# Patient Record
Sex: Male | Born: 1935 | Marital: Married
Health system: Southern US, Community
[De-identification: ages and names within clinical notes are randomized; demographics above are authoritative.]

---

## 2007-06-09 ENCOUNTER — Ambulatory Visit: Payer: Self-pay | Admitting: Internal Medicine

## 2007-11-14 ENCOUNTER — Ambulatory Visit: Payer: Self-pay | Admitting: Internal Medicine

## 2011-05-07 ENCOUNTER — Inpatient Hospital Stay: Payer: Self-pay | Admitting: Internal Medicine

## 2012-03-09 ENCOUNTER — Encounter: Payer: Self-pay | Admitting: Cardiothoracic Surgery

## 2012-03-09 ENCOUNTER — Encounter: Payer: Self-pay | Admitting: Nurse Practitioner

## 2012-03-25 ENCOUNTER — Encounter: Payer: Self-pay | Admitting: Cardiothoracic Surgery

## 2012-03-25 ENCOUNTER — Encounter: Payer: Self-pay | Admitting: Nurse Practitioner

## 2012-04-25 ENCOUNTER — Encounter: Payer: Self-pay | Admitting: Cardiothoracic Surgery

## 2012-04-25 ENCOUNTER — Encounter: Payer: Self-pay | Admitting: Nurse Practitioner

## 2012-05-25 ENCOUNTER — Encounter: Payer: Self-pay | Admitting: Cardiothoracic Surgery

## 2012-05-25 ENCOUNTER — Encounter: Payer: Self-pay | Admitting: Nurse Practitioner

## 2012-06-25 ENCOUNTER — Encounter: Payer: Self-pay | Admitting: Cardiothoracic Surgery

## 2012-06-25 ENCOUNTER — Encounter: Payer: Self-pay | Admitting: Nurse Practitioner

## 2012-12-17 ENCOUNTER — Ambulatory Visit: Payer: Self-pay | Admitting: Family Medicine

## 2013-04-01 ENCOUNTER — Ambulatory Visit: Payer: Self-pay | Admitting: Family Medicine

## 2013-04-01 ENCOUNTER — Inpatient Hospital Stay: Payer: Self-pay | Admitting: Internal Medicine

## 2013-04-01 LAB — TROPONIN I: Troponin-I: 0.02 ng/mL

## 2013-04-01 LAB — URINALYSIS, COMPLETE
Bacteria: NONE SEEN
Bacteria: NONE SEEN
Bilirubin,UR: NEGATIVE
Bilirubin,UR: NEGATIVE
Glucose,UR: NEGATIVE mg/dL (ref 0–75)
Hyaline Cast: 3
Ketone: NEGATIVE
Leukocyte Esterase: NEGATIVE
Leukocyte Esterase: NEGATIVE
Leukocyte Esterase: NEGATIVE
Nitrite: NEGATIVE
Nitrite: NEGATIVE
Nitrite: NEGATIVE
Ph: 6 (ref 4.5–8.0)
Ph: 5 (ref 4.5–8.0)
Protein: NEGATIVE
RBC,UR: 1 /HPF (ref 0–5)
Specific Gravity: 1.011 (ref 1.003–1.030)
Squamous Epithelial: NONE SEEN
WBC UR: 1 /HPF (ref 0–5)

## 2013-04-01 LAB — CBC WITH DIFFERENTIAL/PLATELET
Basophil #: 0.1 10*3/uL (ref 0.0–0.1)
Basophil %: 1.2 %
Eosinophil %: 0.8 %
HCT: 38.1 % — ABNORMAL LOW (ref 40.0–52.0)
Lymphocyte #: 2 10*3/uL (ref 1.0–3.6)
Lymphocyte %: 19.3 %
MCHC: 33 g/dL (ref 32.0–36.0)
MCV: 96 fL (ref 80–100)
Monocyte #: 1 x10 3/mm (ref 0.2–1.0)
Monocyte %: 10.1 %
Neutrophil #: 7.1 10*3/uL — ABNORMAL HIGH (ref 1.4–6.5)
Neutrophil %: 68.6 %
RDW: 18.1 % — ABNORMAL HIGH (ref 11.5–14.5)

## 2013-04-01 LAB — CBC
HCT: 37.6 % — ABNORMAL LOW (ref 40.0–52.0)
MCHC: 33.6 g/dL (ref 32.0–36.0)
Platelet: 73 10*3/uL — ABNORMAL LOW (ref 150–440)
RDW: 18.1 % — ABNORMAL HIGH (ref 11.5–14.5)
WBC: 9.7 10*3/uL (ref 3.8–10.6)

## 2013-04-01 LAB — COMPREHENSIVE METABOLIC PANEL
Albumin: 3.2 g/dL — ABNORMAL LOW (ref 3.4–5.0)
Albumin: 3 g/dL — ABNORMAL LOW (ref 3.4–5.0)
Alkaline Phosphatase: 91 U/L (ref 50–136)
Alkaline Phosphatase: 90 U/L (ref 50–136)
Anion Gap: 9 (ref 7–16)
BUN: 94 mg/dL — ABNORMAL HIGH (ref 7–18)
Bilirubin,Total: 0.7 mg/dL (ref 0.2–1.0)
Calcium, Total: 9.3 mg/dL (ref 8.5–10.1)
Chloride: 104 mmol/L (ref 98–107)
Co2: 25 mmol/L (ref 21–32)
EGFR (Non-African Amer.): 16 — ABNORMAL LOW
Glucose: 109 mg/dL — ABNORMAL HIGH (ref 65–99)
Potassium: 4.5 mmol/L (ref 3.5–5.1)
SGOT(AST): 41 U/L — ABNORMAL HIGH (ref 15–37)
SGPT (ALT): 22 U/L (ref 12–78)
Sodium: 137 mmol/L (ref 136–145)
Sodium: 137 mmol/L (ref 136–145)
Total Protein: 8.2 g/dL (ref 6.4–8.2)
Total Protein: 7.8 g/dL (ref 6.4–8.2)

## 2013-04-01 LAB — PRO B NATRIURETIC PEPTIDE: B-Type Natriuretic Peptide: 1922 pg/mL — ABNORMAL HIGH (ref 0–450)

## 2013-04-02 LAB — CBC WITH DIFFERENTIAL/PLATELET
Basophil #: 0 10*3/uL (ref 0.0–0.1)
Eosinophil #: 0.1 10*3/uL (ref 0.0–0.7)
HGB: 11.3 g/dL — ABNORMAL LOW (ref 13.0–18.0)
Lymphocyte #: 1.3 10*3/uL (ref 1.0–3.6)
Lymphocyte %: 20.9 %
MCHC: 33.9 g/dL (ref 32.0–36.0)
MCV: 96 fL (ref 80–100)
Monocyte %: 11.3 %
Neutrophil #: 4.3 10*3/uL (ref 1.4–6.5)
Neutrophil %: 66.8 %
Platelet: 59 10*3/uL — ABNORMAL LOW (ref 150–440)
RBC: 3.49 10*6/uL — ABNORMAL LOW (ref 4.40–5.90)
RDW: 18.3 % — ABNORMAL HIGH (ref 11.5–14.5)
WBC: 6.4 10*3/uL (ref 3.8–10.6)

## 2013-04-02 LAB — BASIC METABOLIC PANEL
BUN: 75 mg/dL — ABNORMAL HIGH (ref 7–18)
Chloride: 109 mmol/L — ABNORMAL HIGH (ref 98–107)
EGFR (African American): 33 — ABNORMAL LOW
EGFR (Non-African Amer.): 28 — ABNORMAL LOW
Glucose: 88 mg/dL (ref 65–99)
Osmolality: 305 (ref 275–301)
Potassium: 4 mmol/L (ref 3.5–5.1)
Sodium: 142 mmol/L (ref 136–145)

## 2013-04-03 LAB — CREATININE, SERUM: EGFR (Non-African Amer.): 40 — ABNORMAL LOW

## 2013-07-12 ENCOUNTER — Ambulatory Visit: Payer: Self-pay | Admitting: Internal Medicine

## 2013-07-26 ENCOUNTER — Ambulatory Visit: Payer: Self-pay | Admitting: Internal Medicine

## 2013-07-28 ENCOUNTER — Inpatient Hospital Stay: Payer: Self-pay | Admitting: Internal Medicine

## 2013-07-28 LAB — CBC WITH DIFFERENTIAL/PLATELET
Basophil #: 0.1 10*3/uL (ref 0.0–0.1)
Basophil %: 0.9 %
Eosinophil #: 0 10*3/uL (ref 0.0–0.7)
HCT: 31.7 % — ABNORMAL LOW (ref 40.0–52.0)
Lymphocyte #: 0.9 10*3/uL — ABNORMAL LOW (ref 1.0–3.6)
Lymphocyte %: 13.8 %
Monocyte #: 0.7 x10 3/mm (ref 0.2–1.0)
Monocyte %: 10 %
Neutrophil #: 5.1 10*3/uL (ref 1.4–6.5)
Platelet: 117 10*3/uL — ABNORMAL LOW (ref 150–440)
RBC: 3.44 10*6/uL — ABNORMAL LOW (ref 4.40–5.90)
RDW: 16.1 % — ABNORMAL HIGH (ref 11.5–14.5)
WBC: 6.8 10*3/uL (ref 3.8–10.6)

## 2013-07-28 LAB — BASIC METABOLIC PANEL
Anion Gap: 4 — ABNORMAL LOW (ref 7–16)
Calcium, Total: 9.1 mg/dL (ref 8.5–10.1)
Co2: 32 mmol/L (ref 21–32)
Creatinine: 1.32 mg/dL — ABNORMAL HIGH (ref 0.60–1.30)
EGFR (Non-African Amer.): 52 — ABNORMAL LOW
Osmolality: 289 (ref 275–301)
Potassium: 4.4 mmol/L (ref 3.5–5.1)
Sodium: 135 mmol/L — ABNORMAL LOW (ref 136–145)

## 2013-07-29 LAB — CBC WITH DIFFERENTIAL/PLATELET
Basophil #: 0 10*3/uL (ref 0.0–0.1)
Basophil %: 0.3 %
Eosinophil #: 0 10*3/uL (ref 0.0–0.7)
Eosinophil %: 0.8 %
HCT: 27.9 % — ABNORMAL LOW (ref 40.0–52.0)
HGB: 9.2 g/dL — ABNORMAL LOW (ref 13.0–18.0)
Lymphocyte #: 1.1 10*3/uL (ref 1.0–3.6)
MCHC: 33 g/dL (ref 32.0–36.0)
Monocyte #: 0.8 x10 3/mm (ref 0.2–1.0)
Neutrophil %: 68.6 %
RBC: 3.06 10*6/uL — ABNORMAL LOW (ref 4.40–5.90)
RDW: 15.3 % — ABNORMAL HIGH (ref 11.5–14.5)
WBC: 6.3 10*3/uL (ref 3.8–10.6)

## 2013-07-29 LAB — BASIC METABOLIC PANEL
BUN: 36 mg/dL — ABNORMAL HIGH (ref 7–18)
Calcium, Total: 8.9 mg/dL (ref 8.5–10.1)
Chloride: 99 mmol/L (ref 98–107)
EGFR (African American): >60
EGFR (Non-African Amer.): 56 — ABNORMAL LOW
Glucose: 90 mg/dL (ref 65–99)
Potassium: 4.4 mmol/L (ref 3.5–5.1)
Sodium: 135 mmol/L — ABNORMAL LOW (ref 136–145)

## 2013-07-31 LAB — BASIC METABOLIC PANEL
Anion Gap: 5 — ABNORMAL LOW (ref 7–16)
BUN: 52 mg/dL — ABNORMAL HIGH (ref 7–18)
Co2: 28 mmol/L (ref 21–32)
Creatinine: 1.89 mg/dL — ABNORMAL HIGH (ref 0.60–1.30)
EGFR (African American): 39 — ABNORMAL LOW
Glucose: 126 mg/dL — ABNORMAL HIGH (ref 65–99)

## 2013-07-31 LAB — CBC WITH DIFFERENTIAL/PLATELET
Basophil %: 0.1 %
Eosinophil %: 1.3 %
HCT: 27.9 % — ABNORMAL LOW (ref 40.0–52.0)
Lymphocyte #: 1.3 10*3/uL (ref 1.0–3.6)
Lymphocyte %: 20.1 %
MCV: 91 fL (ref 80–100)
Monocyte #: 0.7 x10 3/mm (ref 0.2–1.0)
Neutrophil #: 4.4 10*3/uL (ref 1.4–6.5)
Neutrophil %: 67.3 %
Platelet: 107 10*3/uL — ABNORMAL LOW (ref 150–440)
RBC: 3.08 10*6/uL — ABNORMAL LOW (ref 4.40–5.90)
RDW: 15.3 % — ABNORMAL HIGH (ref 11.5–14.5)
WBC: 6.5 10*3/uL (ref 3.8–10.6)

## 2013-08-01 LAB — CBC WITH DIFFERENTIAL/PLATELET
Basophil #: 0 10*3/uL (ref 0.0–0.1)
Eosinophil #: 0 10*3/uL (ref 0.0–0.7)
Eosinophil %: 0.5 %
HCT: 29.1 % — ABNORMAL LOW (ref 40.0–52.0)
Lymphocyte #: 1 10*3/uL (ref 1.0–3.6)
Lymphocyte %: 19 %
MCH: 30.6 pg (ref 26.0–34.0)
MCHC: 33.4 g/dL (ref 32.0–36.0)
MCV: 91 fL (ref 80–100)
Monocyte #: 0.6 x10 3/mm (ref 0.2–1.0)
Monocyte %: 10.7 %
Neutrophil %: 69.1 %
Platelet: 117 10*3/uL — ABNORMAL LOW (ref 150–440)
RDW: 15.7 % — ABNORMAL HIGH (ref 11.5–14.5)
WBC: 5.4 10*3/uL (ref 3.8–10.6)

## 2013-08-01 LAB — BASIC METABOLIC PANEL
Anion Gap: 9 (ref 7–16)
Chloride: 97 mmol/L — ABNORMAL LOW (ref 98–107)
Co2: 26 mmol/L (ref 21–32)
Creatinine: 1.46 mg/dL — ABNORMAL HIGH (ref 0.60–1.30)
EGFR (Non-African Amer.): 46 — ABNORMAL LOW
Glucose: 128 mg/dL — ABNORMAL HIGH (ref 65–99)
Potassium: 4.3 mmol/L (ref 3.5–5.1)

## 2013-08-02 LAB — BASIC METABOLIC PANEL
Anion Gap: 3 — ABNORMAL LOW (ref 7–16)
BUN: 37 mg/dL — ABNORMAL HIGH (ref 7–18)
Chloride: 104 mmol/L (ref 98–107)
Creatinine: 1.11 mg/dL (ref 0.60–1.30)
EGFR (African American): >60
Glucose: 98 mg/dL (ref 65–99)
Potassium: 4.9 mmol/L (ref 3.5–5.1)
Sodium: 136 mmol/L (ref 136–145)

## 2013-08-02 LAB — CULTURE, BLOOD (SINGLE)

## 2013-08-04 LAB — CBC WITH DIFFERENTIAL/PLATELET
Basophil %: 0.6 %
Eosinophil #: 0.1 10*3/uL (ref 0.0–0.7)
Eosinophil %: 1.4 %
HGB: 9.7 g/dL — ABNORMAL LOW (ref 13.0–18.0)
Lymphocyte #: 1.2 10*3/uL (ref 1.0–3.6)
Lymphocyte %: 19.8 %
MCH: 31.3 pg (ref 26.0–34.0)
MCHC: 33.5 g/dL (ref 32.0–36.0)
MCV: 93 fL (ref 80–100)
Monocyte #: 0.7 x10 3/mm (ref 0.2–1.0)
Monocyte %: 12 %
Neutrophil #: 3.9 10*3/uL (ref 1.4–6.5)
Neutrophil %: 66.2 %
RDW: 16.5 % — ABNORMAL HIGH (ref 11.5–14.5)

## 2013-08-04 LAB — PROTIME-INR: INR: 1.1

## 2013-08-05 ENCOUNTER — Ambulatory Visit: Payer: Self-pay | Admitting: Oncology

## 2013-08-07 LAB — BASIC METABOLIC PANEL
Anion Gap: 4 — ABNORMAL LOW (ref 7–16)
BUN: 35 mg/dL — ABNORMAL HIGH (ref 7–18)
Chloride: 103 mmol/L (ref 98–107)
EGFR (Non-African Amer.): >60
Glucose: 95 mg/dL (ref 65–99)
Osmolality: 276 (ref 275–301)
Potassium: 5.6 mmol/L — ABNORMAL HIGH (ref 3.5–5.1)

## 2013-08-07 LAB — POTASSIUM: Potassium: 5.5 mmol/L — ABNORMAL HIGH (ref 3.5–5.1)

## 2013-08-07 LAB — BRONCHIAL WASH CULTURE

## 2013-08-08 LAB — BASIC METABOLIC PANEL
Calcium, Total: 9.1 mg/dL (ref 8.5–10.1)
Chloride: 103 mmol/L (ref 98–107)
Creatinine: 1.03 mg/dL (ref 0.60–1.30)
EGFR (Non-African Amer.): >60
Glucose: 181 mg/dL — ABNORMAL HIGH (ref 65–99)
Osmolality: 283 (ref 275–301)
Potassium: 5.1 mmol/L (ref 3.5–5.1)
Sodium: 136 mmol/L (ref 136–145)

## 2013-08-09 ENCOUNTER — Ambulatory Visit: Payer: Self-pay | Admitting: Oncology

## 2013-08-25 ENCOUNTER — Ambulatory Visit: Payer: Self-pay | Admitting: Oncology

## 2013-09-05 ENCOUNTER — Ambulatory Visit: Payer: Self-pay | Admitting: Oncology

## 2013-09-11 ENCOUNTER — Inpatient Hospital Stay: Payer: Self-pay | Admitting: Family Medicine

## 2013-09-11 LAB — COMPREHENSIVE METABOLIC PANEL
ANION GAP: 7 (ref 7–16)
Albumin: 1.9 g/dL — ABNORMAL LOW (ref 3.4–5.0)
Alkaline Phosphatase: 199 U/L — ABNORMAL HIGH
BILIRUBIN TOTAL: 2.5 mg/dL — AB (ref 0.2–1.0)
BUN: 37 mg/dL — AB (ref 7–18)
Calcium, Total: 7.9 mg/dL — ABNORMAL LOW (ref 8.5–10.1)
Chloride: 92 mmol/L — ABNORMAL LOW (ref 98–107)
Co2: 28 mmol/L (ref 21–32)
Creatinine: 1.72 mg/dL — ABNORMAL HIGH (ref 0.60–1.30)
EGFR (African American): 44 — ABNORMAL LOW
EGFR (Non-African Amer.): 38 — ABNORMAL LOW
GLUCOSE: 133 mg/dL — AB (ref 65–99)
OSMOLALITY: 266 (ref 275–301)
POTASSIUM: 4.3 mmol/L (ref 3.5–5.1)
SGOT(AST): 212 U/L — ABNORMAL HIGH (ref 15–37)
SGPT (ALT): 89 U/L — ABNORMAL HIGH (ref 12–78)
SODIUM: 127 mmol/L — AB (ref 136–145)
Total Protein: 5.9 g/dL — ABNORMAL LOW (ref 6.4–8.2)

## 2013-09-11 LAB — CBC
HCT: 29.1 % — AB (ref 40.0–52.0)
HGB: 9.5 g/dL — AB (ref 13.0–18.0)
MCH: 30.2 pg (ref 26.0–34.0)
MCHC: 32.6 g/dL (ref 32.0–36.0)
MCV: 93 fL (ref 80–100)
Platelet: 122 10*3/uL — ABNORMAL LOW (ref 150–440)
RBC: 3.14 10*6/uL — ABNORMAL LOW (ref 4.40–5.90)
RDW: 16.4 % — AB (ref 11.5–14.5)
WBC: 11.3 10*3/uL — AB (ref 3.8–10.6)

## 2013-09-11 LAB — URINALYSIS, COMPLETE
BLOOD: NEGATIVE
Bacteria: NONE SEEN
Bilirubin,UR: NEGATIVE
Glucose,UR: NEGATIVE mg/dL (ref 0–75)
Granular Cast: 4
KETONE: NEGATIVE
Leukocyte Esterase: NEGATIVE
Nitrite: NEGATIVE
Ph: 5 (ref 4.5–8.0)
Protein: NEGATIVE
RBC,UR: <1 /HPF (ref 0–5)
Specific Gravity: 1.016 (ref 1.003–1.030)
Squamous Epithelial: <1
WBC UR: 3 /HPF (ref 0–5)

## 2013-09-11 LAB — TROPONIN I
TROPONIN-I: 0.03 ng/mL
Troponin-I: <0.02 ng/mL

## 2013-09-11 LAB — RAPID INFLUENZA A&B ANTIGENS

## 2013-09-11 LAB — LIPASE, BLOOD: Lipase: 205 U/L (ref 73–393)

## 2013-09-11 LAB — PRO B NATRIURETIC PEPTIDE: B-Type Natriuretic Peptide: 4186 pg/mL — ABNORMAL HIGH (ref 0–450)

## 2013-09-11 LAB — MAGNESIUM: Magnesium: 1.4 mg/dL — ABNORMAL LOW

## 2013-09-12 LAB — CBC WITH DIFFERENTIAL/PLATELET
Basophil #: 0 10*3/uL (ref 0.0–0.1)
Basophil %: 0.3 %
Eosinophil #: 0 10*3/uL (ref 0.0–0.7)
Eosinophil %: 0 %
HCT: 29.2 % — AB (ref 40.0–52.0)
HGB: 9.5 g/dL — ABNORMAL LOW (ref 13.0–18.0)
LYMPHS PCT: 5.8 %
Lymphocyte #: 0.3 10*3/uL — ABNORMAL LOW (ref 1.0–3.6)
MCH: 30.5 pg (ref 26.0–34.0)
MCHC: 32.5 g/dL (ref 32.0–36.0)
MCV: 94 fL (ref 80–100)
MONO ABS: 0.3 x10 3/mm (ref 0.2–1.0)
MONOS PCT: 4.9 %
Neutrophil #: 4.7 10*3/uL (ref 1.4–6.5)
Neutrophil %: 89 %
Platelet: 121 10*3/uL — ABNORMAL LOW (ref 150–440)
RBC: 3.11 10*6/uL — ABNORMAL LOW (ref 4.40–5.90)
RDW: 15.8 % — ABNORMAL HIGH (ref 11.5–14.5)
WBC: 5.3 10*3/uL (ref 3.8–10.6)

## 2013-09-12 LAB — COMPREHENSIVE METABOLIC PANEL
ALBUMIN: 1.7 g/dL — AB (ref 3.4–5.0)
Alkaline Phosphatase: 172 U/L — ABNORMAL HIGH
Anion Gap: 6 — ABNORMAL LOW (ref 7–16)
BUN: 36 mg/dL — AB (ref 7–18)
Bilirubin,Total: 1.5 mg/dL — ABNORMAL HIGH (ref 0.2–1.0)
CALCIUM: 7.9 mg/dL — AB (ref 8.5–10.1)
CO2: 28 mmol/L (ref 21–32)
Chloride: 93 mmol/L — ABNORMAL LOW (ref 98–107)
Creatinine: 1.63 mg/dL — ABNORMAL HIGH (ref 0.60–1.30)
EGFR (African American): 47 — ABNORMAL LOW
EGFR (Non-African Amer.): 40 — ABNORMAL LOW
Glucose: 276 mg/dL — ABNORMAL HIGH (ref 65–99)
OSMOLALITY: 273 (ref 275–301)
Potassium: 4 mmol/L (ref 3.5–5.1)
SGOT(AST): 148 U/L — ABNORMAL HIGH (ref 15–37)
SGPT (ALT): 82 U/L — ABNORMAL HIGH (ref 12–78)
Sodium: 127 mmol/L — ABNORMAL LOW (ref 136–145)
Total Protein: 5.8 g/dL — ABNORMAL LOW (ref 6.4–8.2)

## 2013-09-12 LAB — MAGNESIUM: Magnesium: 2 mg/dL

## 2013-09-12 LAB — URINE CULTURE

## 2013-09-12 LAB — CLOSTRIDIUM DIFFICILE(ARMC)

## 2013-09-13 LAB — HEPATIC FUNCTION PANEL A (ARMC)
ALBUMIN: 1.8 g/dL — AB (ref 3.4–5.0)
ALK PHOS: 196 U/L — AB
ALT: 84 U/L — AB (ref 12–78)
Bilirubin, Direct: 0.8 mg/dL — ABNORMAL HIGH (ref 0.00–0.20)
Bilirubin,Total: 1 mg/dL (ref 0.2–1.0)
SGOT(AST): 141 U/L — ABNORMAL HIGH (ref 15–37)
TOTAL PROTEIN: 5.8 g/dL — AB (ref 6.4–8.2)

## 2013-09-13 LAB — BASIC METABOLIC PANEL
ANION GAP: 3 — AB (ref 7–16)
BUN: 38 mg/dL — ABNORMAL HIGH (ref 7–18)
Calcium, Total: 8.2 mg/dL — ABNORMAL LOW (ref 8.5–10.1)
Chloride: 93 mmol/L — ABNORMAL LOW (ref 98–107)
Co2: 30 mmol/L (ref 21–32)
Creatinine: 1.44 mg/dL — ABNORMAL HIGH (ref 0.60–1.30)
EGFR (African American): 54 — ABNORMAL LOW
GFR CALC NON AF AMER: 47 — AB
Glucose: 300 mg/dL — ABNORMAL HIGH (ref 65–99)
OSMOLALITY: 274 (ref 275–301)
Potassium: 4.3 mmol/L (ref 3.5–5.1)
SODIUM: 126 mmol/L — AB (ref 136–145)

## 2013-09-13 LAB — VANCOMYCIN, TROUGH: VANCOMYCIN, TROUGH: 25 ug/mL — AB (ref 10–20)

## 2013-09-14 LAB — BASIC METABOLIC PANEL
ANION GAP: 5 — AB (ref 7–16)
BUN: 46 mg/dL — ABNORMAL HIGH (ref 7–18)
CALCIUM: 8.7 mg/dL (ref 8.5–10.1)
CO2: 30 mmol/L (ref 21–32)
CREATININE: 1.48 mg/dL — AB (ref 0.60–1.30)
Chloride: 96 mmol/L — ABNORMAL LOW (ref 98–107)
EGFR (Non-African Amer.): 45 — ABNORMAL LOW
GFR CALC AF AMER: 53 — AB
Glucose: 309 mg/dL — ABNORMAL HIGH (ref 65–99)
Osmolality: 286 (ref 275–301)
POTASSIUM: 4.6 mmol/L (ref 3.5–5.1)
Sodium: 131 mmol/L — ABNORMAL LOW (ref 136–145)

## 2013-09-14 LAB — BODY FLUID CELL COUNT WITH DIFFERENTIAL
BASOS ABS: 0 %
EOS PCT: 0 %
Lymphocytes: 7 %
NEUTROS PCT: 72 %
Nucleated Cell Count: 134 /mm3
Other Cells BF: 0 %
Other Mononuclear Cells: 21 %

## 2013-09-14 LAB — PROTEIN, BODY FLUID: Protein, Body Fluid: 1.5 g/dL

## 2013-09-14 LAB — CBC WITH DIFFERENTIAL/PLATELET
BASOS ABS: 0 10*3/uL (ref 0.0–0.1)
BASOS PCT: 0.2 %
EOS PCT: 0 %
Eosinophil #: 0 10*3/uL (ref 0.0–0.7)
HCT: 30.4 % — ABNORMAL LOW (ref 40.0–52.0)
HGB: 9.8 g/dL — AB (ref 13.0–18.0)
Lymphocyte #: 0.3 10*3/uL — ABNORMAL LOW (ref 1.0–3.6)
Lymphocyte %: 5.2 %
MCH: 30.4 pg (ref 26.0–34.0)
MCHC: 32.2 g/dL (ref 32.0–36.0)
MCV: 94 fL (ref 80–100)
Monocyte #: 0.3 x10 3/mm (ref 0.2–1.0)
Monocyte %: 4.8 %
NEUTROS ABS: 4.8 10*3/uL (ref 1.4–6.5)
NEUTROS PCT: 89.8 %
Platelet: 111 10*3/uL — ABNORMAL LOW (ref 150–440)
RBC: 3.23 10*6/uL — AB (ref 4.40–5.90)
RDW: 16.2 % — AB (ref 11.5–14.5)
WBC: 5.4 10*3/uL (ref 3.8–10.6)

## 2013-09-14 LAB — PROTIME-INR
INR: 1.2
PROTHROMBIN TIME: 15.1 s — AB (ref 11.5–14.7)

## 2013-09-14 LAB — LACTATE DEHYDROGENASE, PLEURAL OR PERITONEAL FLUID: LDH, Body Fluid: 99 U/L

## 2013-09-14 LAB — ALBUMIN, FLUID (OTHER): Body Fluid Albumin: 0.8 g/dL

## 2013-09-14 LAB — GLUCOSE, SEROUS FLUID: Glucose, Body Fluid: 322 mg/dL

## 2013-09-15 LAB — CBC WITH DIFFERENTIAL/PLATELET
Basophil #: 0 10*3/uL (ref 0.0–0.1)
Basophil %: 0.4 %
EOS PCT: 0 %
Eosinophil #: 0 10*3/uL (ref 0.0–0.7)
HCT: 29.5 % — ABNORMAL LOW (ref 40.0–52.0)
HGB: 9.6 g/dL — AB (ref 13.0–18.0)
LYMPHS ABS: 0.3 10*3/uL — AB (ref 1.0–3.6)
LYMPHS PCT: 5.3 %
MCH: 30.3 pg (ref 26.0–34.0)
MCHC: 32.5 g/dL (ref 32.0–36.0)
MCV: 93 fL (ref 80–100)
Monocyte #: 0.4 x10 3/mm (ref 0.2–1.0)
Monocyte %: 6.1 %
NEUTROS PCT: 88.2 %
Neutrophil #: 5.2 10*3/uL (ref 1.4–6.5)
Platelet: 122 10*3/uL — ABNORMAL LOW (ref 150–440)
RBC: 3.17 10*6/uL — AB (ref 4.40–5.90)
RDW: 16 % — ABNORMAL HIGH (ref 11.5–14.5)
WBC: 5.9 10*3/uL (ref 3.8–10.6)

## 2013-09-15 LAB — BASIC METABOLIC PANEL
Anion Gap: 2 — ABNORMAL LOW (ref 7–16)
BUN: 53 mg/dL — ABNORMAL HIGH (ref 7–18)
CALCIUM: 8.9 mg/dL (ref 8.5–10.1)
Chloride: 97 mmol/L — ABNORMAL LOW (ref 98–107)
Co2: 34 mmol/L — ABNORMAL HIGH (ref 21–32)
Creatinine: 1.47 mg/dL — ABNORMAL HIGH (ref 0.60–1.30)
EGFR (African American): 53 — ABNORMAL LOW
EGFR (Non-African Amer.): 46 — ABNORMAL LOW
GLUCOSE: 253 mg/dL — AB (ref 65–99)
OSMOLALITY: 289 (ref 275–301)
POTASSIUM: 4.7 mmol/L (ref 3.5–5.1)
Sodium: 133 mmol/L — ABNORMAL LOW (ref 136–145)

## 2013-09-16 LAB — CULTURE, BLOOD (SINGLE)

## 2013-09-18 LAB — BODY FLUID CULTURE

## 2013-09-28 ENCOUNTER — Ambulatory Visit: Payer: Self-pay | Admitting: Oncology

## 2013-10-07 ENCOUNTER — Inpatient Hospital Stay: Payer: Self-pay | Admitting: Specialist

## 2013-10-07 LAB — CBC
HCT: 31.8 % — ABNORMAL LOW (ref 40.0–52.0)
HGB: 9.9 g/dL — AB (ref 13.0–18.0)
MCH: 29.3 pg (ref 26.0–34.0)
MCHC: 31.1 g/dL — ABNORMAL LOW (ref 32.0–36.0)
MCV: 94 fL (ref 80–100)
PLATELETS: 168 10*3/uL (ref 150–440)
RBC: 3.37 10*6/uL — AB (ref 4.40–5.90)
RDW: 17 % — AB (ref 11.5–14.5)
WBC: 11.3 10*3/uL — ABNORMAL HIGH (ref 3.8–10.6)

## 2013-10-07 LAB — VANCOMYCIN, TROUGH

## 2013-10-07 LAB — COMPREHENSIVE METABOLIC PANEL
ANION GAP: 1 — AB (ref 7–16)
Albumin: 2 g/dL — ABNORMAL LOW (ref 3.4–5.0)
Alkaline Phosphatase: 398 U/L — ABNORMAL HIGH
BILIRUBIN TOTAL: 3.1 mg/dL — AB (ref 0.2–1.0)
BUN: 19 mg/dL — ABNORMAL HIGH (ref 7–18)
CHLORIDE: 92 mmol/L — AB (ref 98–107)
Calcium, Total: 8.9 mg/dL (ref 8.5–10.1)
Co2: 36 mmol/L — ABNORMAL HIGH (ref 21–32)
Creatinine: 1.12 mg/dL (ref 0.60–1.30)
GLUCOSE: 66 mg/dL (ref 65–99)
OSMOLALITY: 259 (ref 275–301)
Potassium: 4.6 mmol/L (ref 3.5–5.1)
SGOT(AST): 164 U/L — ABNORMAL HIGH (ref 15–37)
SGPT (ALT): 66 U/L (ref 12–78)
SODIUM: 129 mmol/L — AB (ref 136–145)
Total Protein: 6.1 g/dL — ABNORMAL LOW (ref 6.4–8.2)

## 2013-10-07 LAB — THEOPHYLLINE LEVEL: Theophylline: 3.1 ug/mL — ABNORMAL LOW (ref 10.0–20.0)

## 2013-10-07 LAB — TSH: THYROID STIMULATING HORM: 6.26 u[IU]/mL — AB

## 2013-10-07 LAB — T4, FREE: FREE THYROXINE: 1.47 ng/dL — AB (ref 0.76–1.46)

## 2013-10-07 LAB — TROPONIN I

## 2013-10-07 LAB — DIGOXIN LEVEL: Digoxin: 0.98 ng/mL

## 2013-10-08 LAB — PROTIME-INR
INR: 1.6
Prothrombin Time: 18.4 secs — ABNORMAL HIGH (ref 11.5–14.7)

## 2013-10-09 LAB — BODY FLUID CELL COUNT WITH DIFFERENTIAL
Basophil: 0 %
EOS PCT: 0 %
LYMPHS PCT: 45 %
NUCLEATED CELL COUNT: 51 /mm3
Neutrophils: 27 %
OTHER MONONUCLEAR CELLS: 27 %
Other Cells BF: 0 %

## 2013-10-09 LAB — CREATININE, SERUM
CREATININE: 1.4 mg/dL — AB (ref 0.60–1.30)
EGFR (Non-African Amer.): 48 — ABNORMAL LOW
GFR CALC AF AMER: 56 — AB

## 2013-10-09 LAB — LACTATE DEHYDROGENASE, PLEURAL OR PERITONEAL FLUID: LDH, BODY FLUID: 66 U/L

## 2013-10-10 LAB — BASIC METABOLIC PANEL
ANION GAP: 6 — AB (ref 7–16)
BUN: 35 mg/dL — ABNORMAL HIGH (ref 7–18)
CHLORIDE: 94 mmol/L — AB (ref 98–107)
CREATININE: 1.34 mg/dL — AB (ref 0.60–1.30)
Calcium, Total: 8.9 mg/dL (ref 8.5–10.1)
Co2: 30 mmol/L (ref 21–32)
EGFR (Non-African Amer.): 51 — ABNORMAL LOW
GFR CALC AF AMER: 59 — AB
GLUCOSE: 315 mg/dL — AB (ref 65–99)
Osmolality: 281 (ref 275–301)
Potassium: 4.7 mmol/L (ref 3.5–5.1)
Sodium: 130 mmol/L — ABNORMAL LOW (ref 136–145)

## 2013-10-10 LAB — CBC WITH DIFFERENTIAL/PLATELET
BASOS PCT: 0.1 %
Basophil #: 0 10*3/uL (ref 0.0–0.1)
EOS ABS: 0 10*3/uL (ref 0.0–0.7)
Eosinophil %: 0 %
HCT: 28.2 % — AB (ref 40.0–52.0)
HGB: 9.4 g/dL — AB (ref 13.0–18.0)
Lymphocyte #: 0.3 10*3/uL — ABNORMAL LOW (ref 1.0–3.6)
Lymphocyte %: 7.5 %
MCH: 31.3 pg (ref 26.0–34.0)
MCHC: 33.5 g/dL (ref 32.0–36.0)
MCV: 94 fL (ref 80–100)
Monocyte #: 0.3 x10 3/mm (ref 0.2–1.0)
Monocyte %: 6.7 %
NEUTROS ABS: 3.4 10*3/uL (ref 1.4–6.5)
Neutrophil %: 85.7 %
Platelet: 134 10*3/uL — ABNORMAL LOW (ref 150–440)
RBC: 3.01 10*6/uL — AB (ref 4.40–5.90)
RDW: 17.3 % — AB (ref 11.5–14.5)
WBC: 4 10*3/uL (ref 3.8–10.6)

## 2013-10-10 LAB — VANCOMYCIN, TROUGH: Vancomycin, Trough: 33 ug/mL (ref 10–20)

## 2013-10-11 LAB — URINALYSIS, COMPLETE
BACTERIA: NONE SEEN
Bilirubin,UR: NEGATIVE
Blood: NEGATIVE
Glucose,UR: >=500 mg/dL (ref 0–75)
KETONE: NEGATIVE
LEUKOCYTE ESTERASE: NEGATIVE
Nitrite: NEGATIVE
PROTEIN: NEGATIVE
Ph: 5 (ref 4.5–8.0)
RBC,UR: NONE SEEN /HPF (ref 0–5)
Specific Gravity: 1.007 (ref 1.003–1.030)
Squamous Epithelial: NONE SEEN
WBC UR: <1 /HPF (ref 0–5)

## 2013-10-11 LAB — VANCOMYCIN, TROUGH: VANCOMYCIN, TROUGH: 27 ug/mL — AB (ref 10–20)

## 2013-10-12 LAB — VANCOMYCIN, TROUGH: Vancomycin, Trough: 17 ug/mL (ref 10–20)

## 2013-10-12 LAB — CULTURE, BLOOD (SINGLE)

## 2013-10-13 LAB — CREATININE, SERUM
Creatinine: 1.1 mg/dL (ref 0.60–1.30)
EGFR (Non-African Amer.): >60

## 2013-10-13 LAB — CULTURE, BLOOD (SINGLE)

## 2013-10-13 LAB — BODY FLUID CULTURE

## 2013-10-21 ENCOUNTER — Inpatient Hospital Stay: Payer: Self-pay | Admitting: Internal Medicine

## 2013-10-21 LAB — CBC
HCT: 28.1 % — ABNORMAL LOW (ref 40.0–52.0)
HGB: 9.2 g/dL — AB (ref 13.0–18.0)
MCH: 30.5 pg (ref 26.0–34.0)
MCHC: 32.8 g/dL (ref 32.0–36.0)
MCV: 93 fL (ref 80–100)
PLATELETS: 101 10*3/uL — AB (ref 150–440)
RBC: 3.02 10*6/uL — ABNORMAL LOW (ref 4.40–5.90)
RDW: 17.6 % — ABNORMAL HIGH (ref 11.5–14.5)
WBC: 20.3 10*3/uL — AB (ref 3.8–10.6)

## 2013-10-21 LAB — URINALYSIS, COMPLETE
BACTERIA: NONE SEEN
BILIRUBIN, UR: NEGATIVE
Blood: NEGATIVE
Glucose,UR: NEGATIVE mg/dL (ref 0–75)
Ketone: NEGATIVE
LEUKOCYTE ESTERASE: NEGATIVE
NITRITE: NEGATIVE
PH: 5 (ref 4.5–8.0)
Protein: NEGATIVE
RBC,UR: 1 /HPF (ref 0–5)
SPECIFIC GRAVITY: 1.014 (ref 1.003–1.030)
Squamous Epithelial: 1
WBC UR: 1 /HPF (ref 0–5)

## 2013-10-21 LAB — COMPREHENSIVE METABOLIC PANEL
ALK PHOS: 144 U/L — AB
ALT: 36 U/L (ref 12–78)
AST: 47 U/L — AB (ref 15–37)
Albumin: 1.9 g/dL — ABNORMAL LOW (ref 3.4–5.0)
Anion Gap: 5 — ABNORMAL LOW (ref 7–16)
BUN: 48 mg/dL — ABNORMAL HIGH (ref 7–18)
Bilirubin,Total: 0.6 mg/dL (ref 0.2–1.0)
Calcium, Total: 8.2 mg/dL — ABNORMAL LOW (ref 8.5–10.1)
Chloride: 90 mmol/L — ABNORMAL LOW (ref 98–107)
Co2: 33 mmol/L — ABNORMAL HIGH (ref 21–32)
Creatinine: 2.07 mg/dL — ABNORMAL HIGH (ref 0.60–1.30)
EGFR (African American): 35 — ABNORMAL LOW
GFR CALC NON AF AMER: 30 — AB
GLUCOSE: 139 mg/dL — AB (ref 65–99)
Osmolality: 272 (ref 275–301)
Potassium: 4 mmol/L (ref 3.5–5.1)
Sodium: 128 mmol/L — ABNORMAL LOW (ref 136–145)
TOTAL PROTEIN: 5.3 g/dL — AB (ref 6.4–8.2)

## 2013-10-21 LAB — PROTIME-INR
INR: 1.2
PROTHROMBIN TIME: 15.4 s — AB (ref 11.5–14.7)

## 2013-10-21 LAB — TROPONIN I: Troponin-I: <0.02 ng/mL

## 2013-10-21 LAB — PRO B NATRIURETIC PEPTIDE: B-TYPE NATIURETIC PEPTID: 3071 pg/mL — AB (ref 0–450)

## 2013-10-22 DIAGNOSIS — I369 Nonrheumatic tricuspid valve disorder, unspecified: Secondary | ICD-10-CM

## 2013-10-22 LAB — BASIC METABOLIC PANEL
Anion Gap: 6 — ABNORMAL LOW (ref 7–16)
BUN: 51 mg/dL — AB (ref 7–18)
CALCIUM: 8.1 mg/dL — AB (ref 8.5–10.1)
CO2: 31 mmol/L (ref 21–32)
Chloride: 92 mmol/L — ABNORMAL LOW (ref 98–107)
Creatinine: 1.99 mg/dL — ABNORMAL HIGH (ref 0.60–1.30)
EGFR (African American): 37 — ABNORMAL LOW
EGFR (Non-African Amer.): 32 — ABNORMAL LOW
GLUCOSE: 144 mg/dL — AB (ref 65–99)
Osmolality: 275 (ref 275–301)
POTASSIUM: 4.1 mmol/L (ref 3.5–5.1)
SODIUM: 129 mmol/L — AB (ref 136–145)

## 2013-10-22 LAB — CBC WITH DIFFERENTIAL/PLATELET
BASOS PCT: 0.1 %
Basophil #: 0 10*3/uL (ref 0.0–0.1)
EOS ABS: 0 10*3/uL (ref 0.0–0.7)
Eosinophil %: 0 %
HCT: 28.3 % — ABNORMAL LOW (ref 40.0–52.0)
HGB: 9.2 g/dL — AB (ref 13.0–18.0)
LYMPHS PCT: 3.8 %
Lymphocyte #: 0.5 10*3/uL — ABNORMAL LOW (ref 1.0–3.6)
MCH: 29.9 pg (ref 26.0–34.0)
MCHC: 32.5 g/dL (ref 32.0–36.0)
MCV: 92 fL (ref 80–100)
MONOS PCT: 1.5 %
Monocyte #: 0.2 x10 3/mm (ref 0.2–1.0)
NEUTROS ABS: 12 10*3/uL — AB (ref 1.4–6.5)
Neutrophil %: 94.6 %
Platelet: 90 10*3/uL — ABNORMAL LOW (ref 150–440)
RBC: 3.08 10*6/uL — AB (ref 4.40–5.90)
RDW: 16.7 % — AB (ref 11.5–14.5)
WBC: 12.7 10*3/uL — AB (ref 3.8–10.6)

## 2013-10-23 ENCOUNTER — Ambulatory Visit: Payer: Self-pay | Admitting: Oncology

## 2013-10-23 LAB — BASIC METABOLIC PANEL
Anion Gap: 6 — ABNORMAL LOW (ref 7–16)
BUN: 53 mg/dL — ABNORMAL HIGH (ref 7–18)
CHLORIDE: 92 mmol/L — AB (ref 98–107)
CO2: 33 mmol/L — AB (ref 21–32)
Calcium, Total: 8 mg/dL — ABNORMAL LOW (ref 8.5–10.1)
Creatinine: 1.8 mg/dL — ABNORMAL HIGH (ref 0.60–1.30)
EGFR (African American): 41 — ABNORMAL LOW
GFR CALC NON AF AMER: 36 — AB
GLUCOSE: 189 mg/dL — AB (ref 65–99)
Osmolality: 282 (ref 275–301)
Potassium: 3.7 mmol/L (ref 3.5–5.1)
SODIUM: 131 mmol/L — AB (ref 136–145)

## 2013-10-23 LAB — CBC WITH DIFFERENTIAL/PLATELET
BASOS ABS: 0 10*3/uL (ref 0.0–0.1)
BASOS PCT: 0.1 %
EOS PCT: 0 %
Eosinophil #: 0 10*3/uL (ref 0.0–0.7)
HCT: 29.2 % — ABNORMAL LOW (ref 40.0–52.0)
HGB: 9.4 g/dL — ABNORMAL LOW (ref 13.0–18.0)
LYMPHS ABS: 0.5 10*3/uL — AB (ref 1.0–3.6)
LYMPHS PCT: 6 %
MCH: 29.7 pg (ref 26.0–34.0)
MCHC: 32.2 g/dL (ref 32.0–36.0)
MCV: 92 fL (ref 80–100)
Monocyte #: 0.1 x10 3/mm — ABNORMAL LOW (ref 0.2–1.0)
Monocyte %: 1.5 %
Neutrophil #: 7.8 10*3/uL — ABNORMAL HIGH (ref 1.4–6.5)
Neutrophil %: 92.4 %
Platelet: 77 10*3/uL — ABNORMAL LOW (ref 150–440)
RBC: 3.17 10*6/uL — ABNORMAL LOW (ref 4.40–5.90)
RDW: 17.2 % — ABNORMAL HIGH (ref 11.5–14.5)
WBC: 8.4 10*3/uL (ref 3.8–10.6)

## 2013-10-23 LAB — CLOSTRIDIUM DIFFICILE(ARMC)

## 2013-10-24 LAB — BASIC METABOLIC PANEL
ANION GAP: 7 (ref 7–16)
BUN: 60 mg/dL — AB (ref 7–18)
CHLORIDE: 93 mmol/L — AB (ref 98–107)
Calcium, Total: 8.4 mg/dL — ABNORMAL LOW (ref 8.5–10.1)
Co2: 33 mmol/L — ABNORMAL HIGH (ref 21–32)
Creatinine: 1.76 mg/dL — ABNORMAL HIGH (ref 0.60–1.30)
EGFR (Non-African Amer.): 37 — ABNORMAL LOW
GFR CALC AF AMER: 43 — AB
Glucose: 104 mg/dL — ABNORMAL HIGH (ref 65–99)
Osmolality: 284 (ref 275–301)
Potassium: 3.7 mmol/L (ref 3.5–5.1)
SODIUM: 133 mmol/L — AB (ref 136–145)

## 2013-10-24 LAB — PROTIME-INR
INR: 1.2
Prothrombin Time: 14.9 secs — ABNORMAL HIGH (ref 11.5–14.7)

## 2013-10-24 LAB — PROTEIN, TOTAL: TOTAL PROTEIN: 5.3 g/dL — AB (ref 6.4–8.2)

## 2013-10-24 LAB — THEOPHYLLINE LEVEL: THEOPHYLLINE: 3.9 ug/mL — AB (ref 10.0–20.0)

## 2013-10-24 LAB — LACTATE DEHYDROGENASE: LDH: 312 U/L — ABNORMAL HIGH (ref 85–241)

## 2013-10-25 LAB — CBC WITH DIFFERENTIAL/PLATELET
BASOS PCT: 0.1 %
Basophil #: 0 10*3/uL (ref 0.0–0.1)
EOS ABS: 0 10*3/uL (ref 0.0–0.7)
Eosinophil %: 0 %
HCT: 26 % — AB (ref 40.0–52.0)
HGB: 8.8 g/dL — ABNORMAL LOW (ref 13.0–18.0)
LYMPHS ABS: 0.7 10*3/uL — AB (ref 1.0–3.6)
Lymphocyte %: 5.7 %
MCH: 30.7 pg (ref 26.0–34.0)
MCHC: 33.6 g/dL (ref 32.0–36.0)
MCV: 91 fL (ref 80–100)
Monocyte #: 0.3 x10 3/mm (ref 0.2–1.0)
Monocyte %: 2.6 %
NEUTROS ABS: 10.7 10*3/uL — AB (ref 1.4–6.5)
Neutrophil %: 91.6 %
Platelet: 42 10*3/uL — ABNORMAL LOW (ref 150–440)
RBC: 2.86 10*6/uL — ABNORMAL LOW (ref 4.40–5.90)
RDW: 17.5 % — AB (ref 11.5–14.5)
WBC: 11.7 10*3/uL — ABNORMAL HIGH (ref 3.8–10.6)

## 2013-10-25 LAB — BASIC METABOLIC PANEL
Anion Gap: 6 — ABNORMAL LOW (ref 7–16)
BUN: 63 mg/dL — ABNORMAL HIGH (ref 7–18)
CHLORIDE: 91 mmol/L — AB (ref 98–107)
CREATININE: 1.78 mg/dL — AB (ref 0.60–1.30)
Calcium, Total: 8.2 mg/dL — ABNORMAL LOW (ref 8.5–10.1)
Co2: 32 mmol/L (ref 21–32)
EGFR (African American): 42 — ABNORMAL LOW
EGFR (Non-African Amer.): 36 — ABNORMAL LOW
GLUCOSE: 229 mg/dL — AB (ref 65–99)
OSMOLALITY: 284 (ref 275–301)
Potassium: 3.8 mmol/L (ref 3.5–5.1)
SODIUM: 129 mmol/L — AB (ref 136–145)

## 2013-10-26 LAB — BASIC METABOLIC PANEL
Anion Gap: 5 — ABNORMAL LOW (ref 7–16)
BUN: 57 mg/dL — ABNORMAL HIGH (ref 7–18)
CALCIUM: 8.2 mg/dL — AB (ref 8.5–10.1)
CHLORIDE: 91 mmol/L — AB (ref 98–107)
CREATININE: 1.74 mg/dL — AB (ref 0.60–1.30)
Co2: 33 mmol/L — ABNORMAL HIGH (ref 21–32)
EGFR (African American): 43 — ABNORMAL LOW
EGFR (Non-African Amer.): 37 — ABNORMAL LOW
Glucose: 319 mg/dL — ABNORMAL HIGH (ref 65–99)
Osmolality: 287 (ref 275–301)
Potassium: 4 mmol/L (ref 3.5–5.1)
Sodium: 129 mmol/L — ABNORMAL LOW (ref 136–145)

## 2013-10-26 LAB — CBC WITH DIFFERENTIAL/PLATELET
BASOS PCT: 1.5 %
Basophil #: 0.2 10*3/uL — ABNORMAL HIGH (ref 0.0–0.1)
Eosinophil #: 0 10*3/uL (ref 0.0–0.7)
Eosinophil %: 0 %
HCT: 27.2 % — AB (ref 40.0–52.0)
HGB: 8.9 g/dL — ABNORMAL LOW (ref 13.0–18.0)
Lymphocyte #: 0.7 10*3/uL — ABNORMAL LOW (ref 1.0–3.6)
Lymphocyte %: 4.9 %
MCH: 30 pg (ref 26.0–34.0)
MCHC: 32.7 g/dL (ref 32.0–36.0)
MCV: 92 fL (ref 80–100)
MONO ABS: 0.3 x10 3/mm (ref 0.2–1.0)
MONOS PCT: 1.9 %
Neutrophil #: 12.8 10*3/uL — ABNORMAL HIGH (ref 1.4–6.5)
Neutrophil %: 91.7 %
PLATELETS: 37 10*3/uL — AB (ref 150–440)
RBC: 2.97 10*6/uL — ABNORMAL LOW (ref 4.40–5.90)
RDW: 17.6 % — ABNORMAL HIGH (ref 11.5–14.5)
WBC: 13.9 10*3/uL — ABNORMAL HIGH (ref 3.8–10.6)

## 2013-10-26 LAB — CULTURE, BLOOD (SINGLE)

## 2013-10-27 LAB — BASIC METABOLIC PANEL
Anion Gap: 5 — ABNORMAL LOW (ref 7–16)
BUN: 56 mg/dL — AB (ref 7–18)
CALCIUM: 8.5 mg/dL (ref 8.5–10.1)
Chloride: 92 mmol/L — ABNORMAL LOW (ref 98–107)
Co2: 33 mmol/L — ABNORMAL HIGH (ref 21–32)
Creatinine: 1.57 mg/dL — ABNORMAL HIGH (ref 0.60–1.30)
EGFR (Non-African Amer.): 42 — ABNORMAL LOW
GFR CALC AF AMER: 49 — AB
GLUCOSE: 190 mg/dL — AB (ref 65–99)
Osmolality: 281 (ref 275–301)
POTASSIUM: 3.9 mmol/L (ref 3.5–5.1)
Sodium: 130 mmol/L — ABNORMAL LOW (ref 136–145)

## 2013-10-27 LAB — CBC WITH DIFFERENTIAL/PLATELET
BASOS ABS: 0.1 10*3/uL (ref 0.0–0.1)
Basophil %: 1 %
Eosinophil #: 0 10*3/uL (ref 0.0–0.7)
Eosinophil %: 0.1 %
HCT: 26 % — AB (ref 40.0–52.0)
HGB: 8.9 g/dL — AB (ref 13.0–18.0)
Lymphocyte #: 0.9 10*3/uL — ABNORMAL LOW (ref 1.0–3.6)
Lymphocyte %: 9.1 %
MCH: 30.6 pg (ref 26.0–34.0)
MCHC: 34.1 g/dL (ref 32.0–36.0)
MCV: 90 fL (ref 80–100)
MONO ABS: 0.2 x10 3/mm (ref 0.2–1.0)
MONOS PCT: 2.4 %
Neutrophil #: 8.5 10*3/uL — ABNORMAL HIGH (ref 1.4–6.5)
Neutrophil %: 87.4 %
Platelet: 29 10*3/uL — CL (ref 150–440)
RBC: 2.89 10*6/uL — AB (ref 4.40–5.90)
RDW: 17.6 % — AB (ref 11.5–14.5)
WBC: 9.7 10*3/uL (ref 3.8–10.6)

## 2013-10-28 ENCOUNTER — Ambulatory Visit: Payer: Self-pay | Admitting: Oncology

## 2013-11-23 ENCOUNTER — Ambulatory Visit: Payer: Self-pay | Admitting: Oncology

## 2013-11-23 DEATH — deceased

## 2014-09-05 IMAGING — CT CT CHEST W/ CM
2 of 3 series · 15 of 36 positions shown, 18 images · IV contrast (isovue)
Comparison: 07/28/2013

CLINICAL DATA: Follow-up lung mass not visualized on bronchoscopy

EXAM:
CT CHEST WITH CONTRAST
TECHNIQUE: Multidetector CT imaging of the chest was performed during
intravenous contrast administration.
CONTRAST:  75 mL Isovue 370 IV

[Series 2: routine chest with · axial · 0.88mm/px · z∈[-387,-72]mm · 12 of 75 slices shown, 15 images]
[im 6/75  mediastinal]
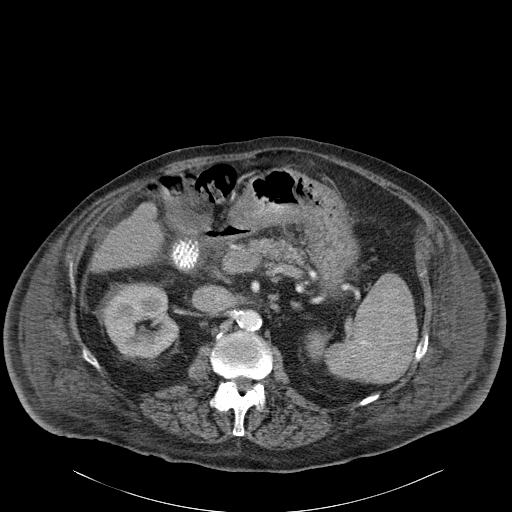
[im 6/75  lung]
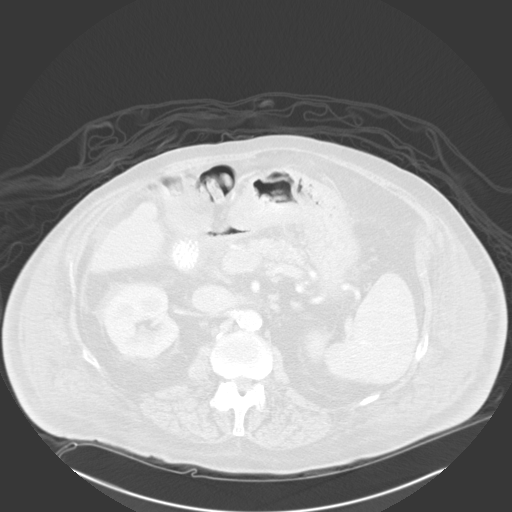
[im 11/75  lung]
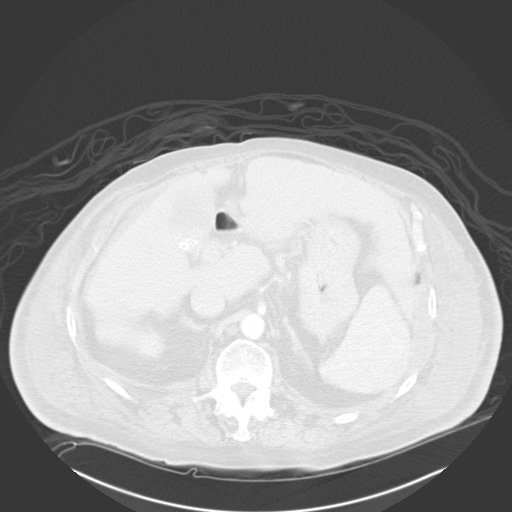
[im 17/75  lung]
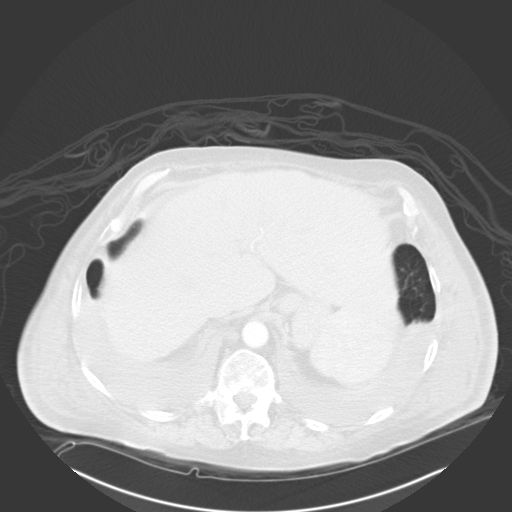
[im 22/75  lung]
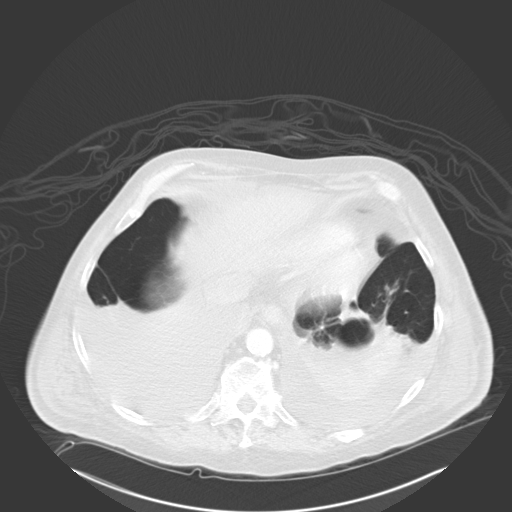
[im 28/75  mediastinal]
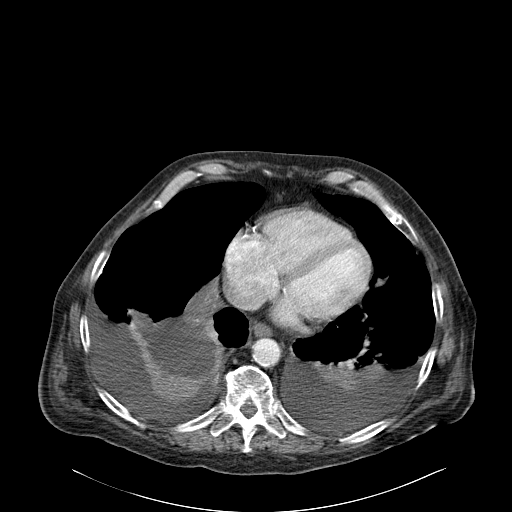
[im 28/75  lung]
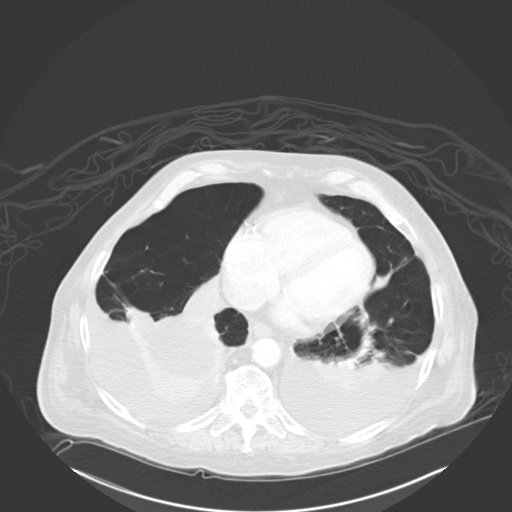
[im 33/75  lung]
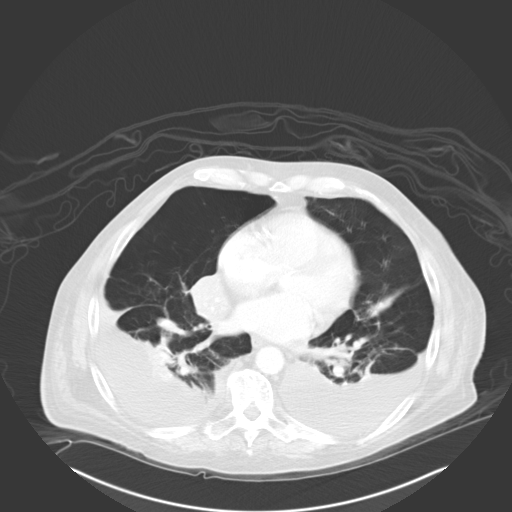
[im 42/75  lung]
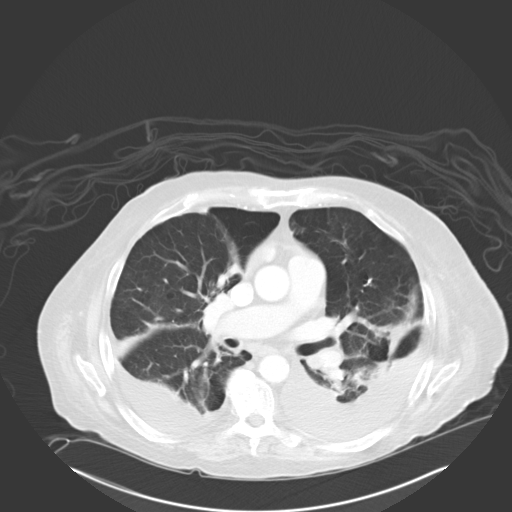
[im 47/75  lung]
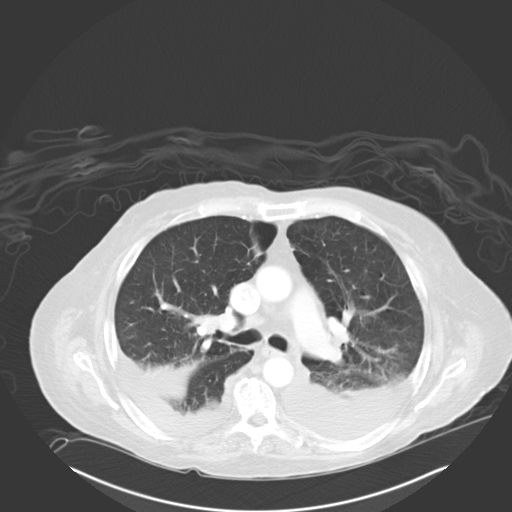
[im 53/75  mediastinal]
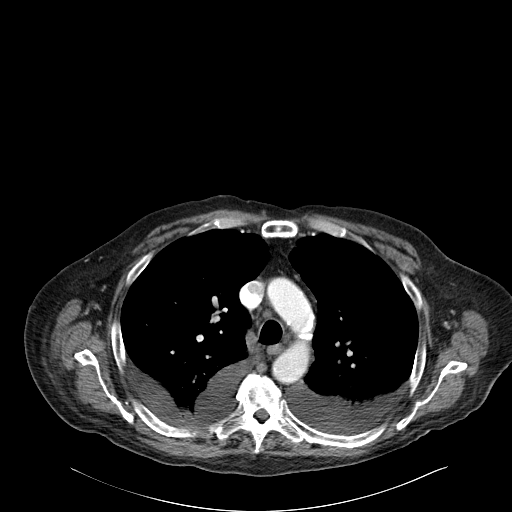
[im 53/75  lung]
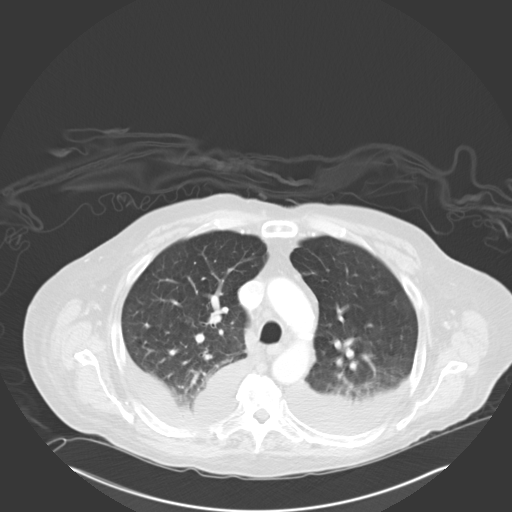
[im 58/75  lung]
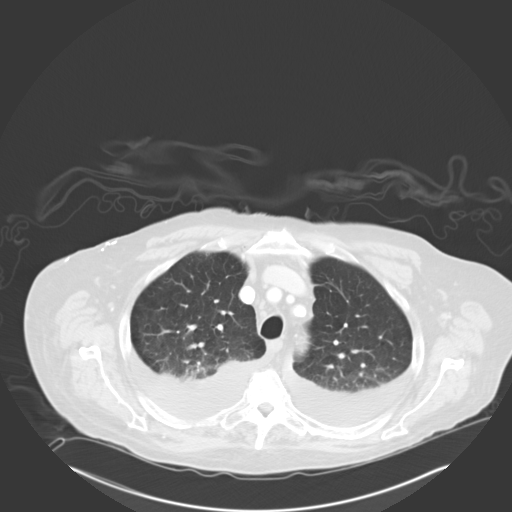
[im 64/75  lung]
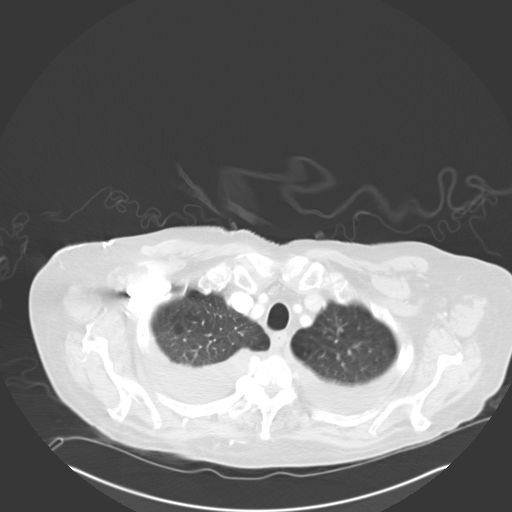
[im 69/75  lung]
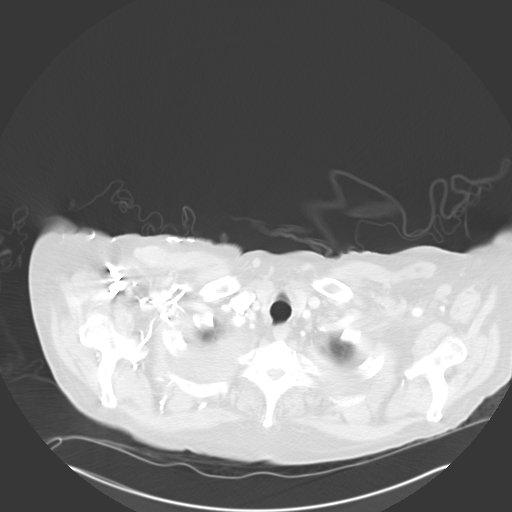

[Series 5: cor routine chest with · coronal · 0.79mm/px · 3 of 143 slices shown]
[im 29/143  lung]
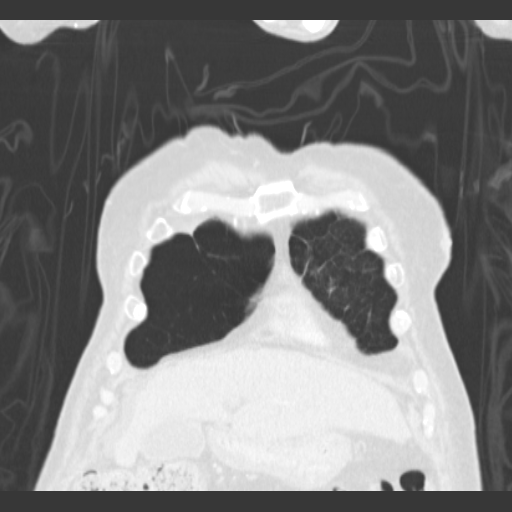
[im 57/143  lung]
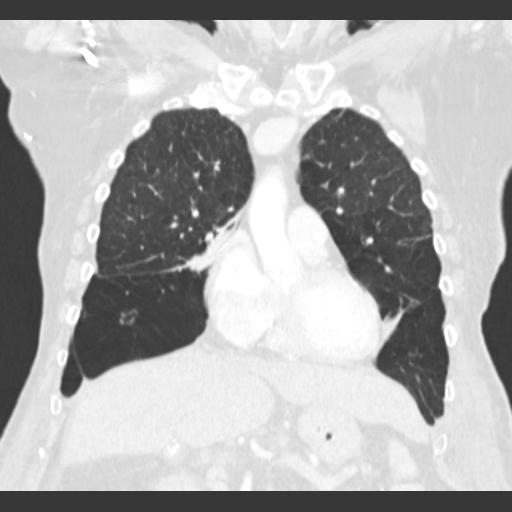
[im 86/143  lung]
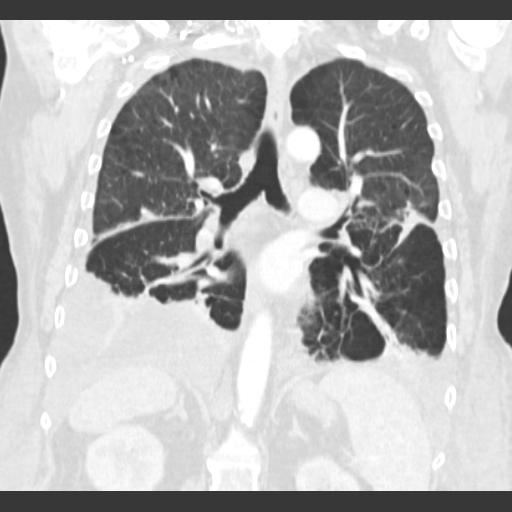

[15 of 36 positions shown; findings below may reference images not displayed]

FINDINGS: Rounded 3.7 x 2.6 cm mass like opacity in the central right middle
lobe (series 2/ image 43), favored to reflect atelectasis and/or
consolidation given vessels coursing through the lesion. However,
there is truncation of the right middle lobe bronchus (series 3/
image 39). Lesion previously measured 4.1 x 3.0 cm when measured in
a similar fashion (series 7/image 43 of the prior study), and
therefore may have mildly improved.

Mild patchy opacity in the lateral left upper lobe (series 3/ image
37), new, favored to reflect atelectasis. Pneumonia is not entirely
excluded.

Moderate bilateral pleural effusions, mildly increased, with
associated lower lobe opacities, favored to reflect compressed
atelectasis.

Underlying moderate centrilobular and paraseptal emphysematous
changes. No pneumothorax.

Visualized thyroid is unremarkable.

The heart is normal in size. No pericardial effusion. Coronary
atherosclerosis. Atherosclerotic calcifications of the aortic arch.

Small mediastinal lymph nodes, including a 9 mm short axis right
paratracheal node (series 2/ image 25) and a 10 mm short axis AP
window node (series 2/ image 27), likely reactive. No suspicious
hilar or axillary lymphadenopathy.

Visualized upper abdomen is notable for cirrhosis and numerous
layering gallstones.

Degenerative changes of the visualized thoracolumbar spine.
IMPRESSION: Rounded 3.7 x 2.6 cm mass like opacity in the central right middle
lobe, favored to reflect atelectasis and/or consolidation, possibly
mildly decreased. Consider short-term follow-up CT chest in
approximately 3-4 weeks. If persistent, consider PET-CT.

Mild patchy opacity in the lateral left upper lobe, new, favored to
reflect atelectasis. Pneumonia is the not entirely excluded.

Moderate bilateral pleural effusions, mildly increased. Associated
lower lobe opacities, favored to reflect compressive atelectasis.

Additional stable ancillary findings as above.

## 2014-10-16 IMAGING — CR DG CHEST POST BIOPSY - THORACENTESIS
1 series · 1 of 1 positions shown · non-contrast
Comparison: Chest x-ray 09/12/2013.

CLINICAL DATA: Thoracentesis.

EXAM:
CHEST XRAY 1 VIEW

[x chest ap]
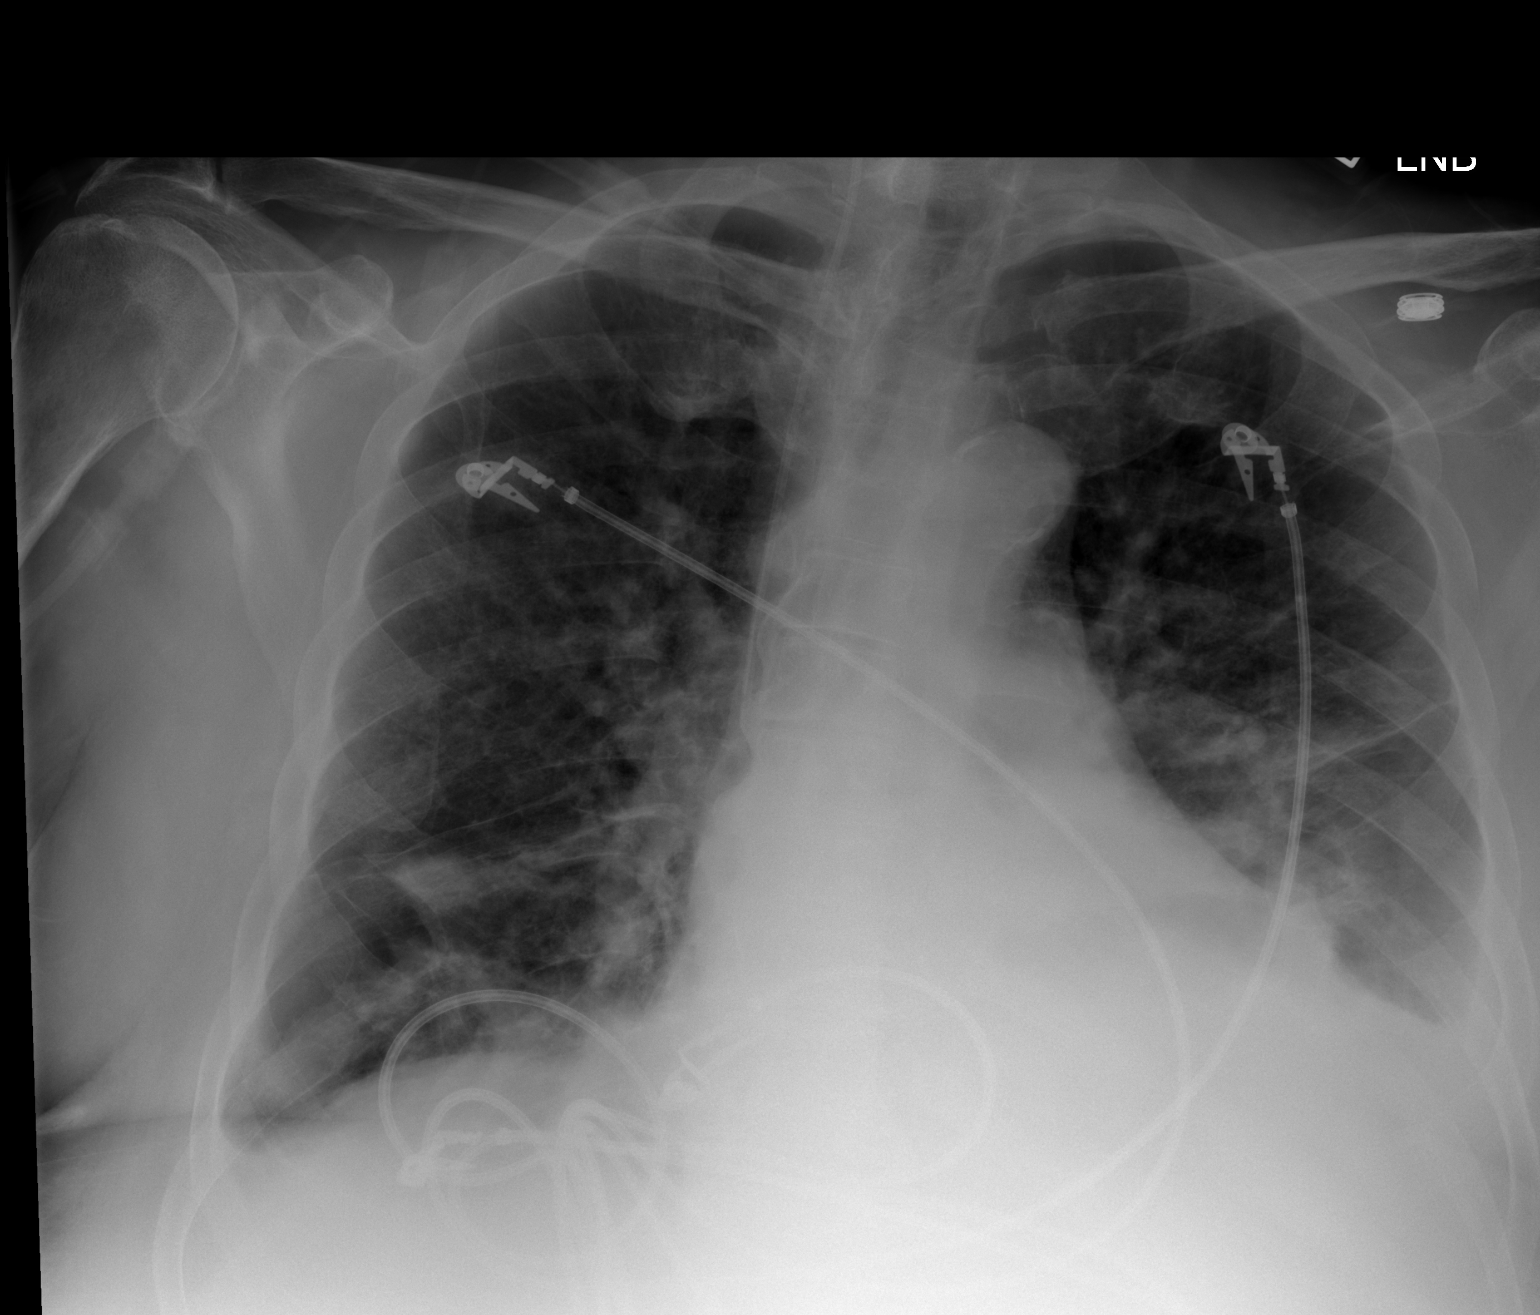

[1 of 1 positions shown; findings below may reference images not displayed]

FINDINGS: Stable right IJ catheter. Stable cardiomegaly and pulmonary vascular
prominence. Stable basilar atelectasis and/or infiltrates. Stable
small left pleural effusion. Previously identified right pleural
effusion has been removed. No pneumothorax. Changes of bullous COPD.
IMPRESSION: No pneumothorax post thoracentesis.

## 2014-11-25 IMAGING — CR DG CHEST 1V PORT
1 series · 1 of 1 positions shown · non-contrast
Comparison: 10/22/2013

CLINICAL DATA: Central line placement.

EXAM:
PORTABLE CHEST - 1 VIEW

[ap]
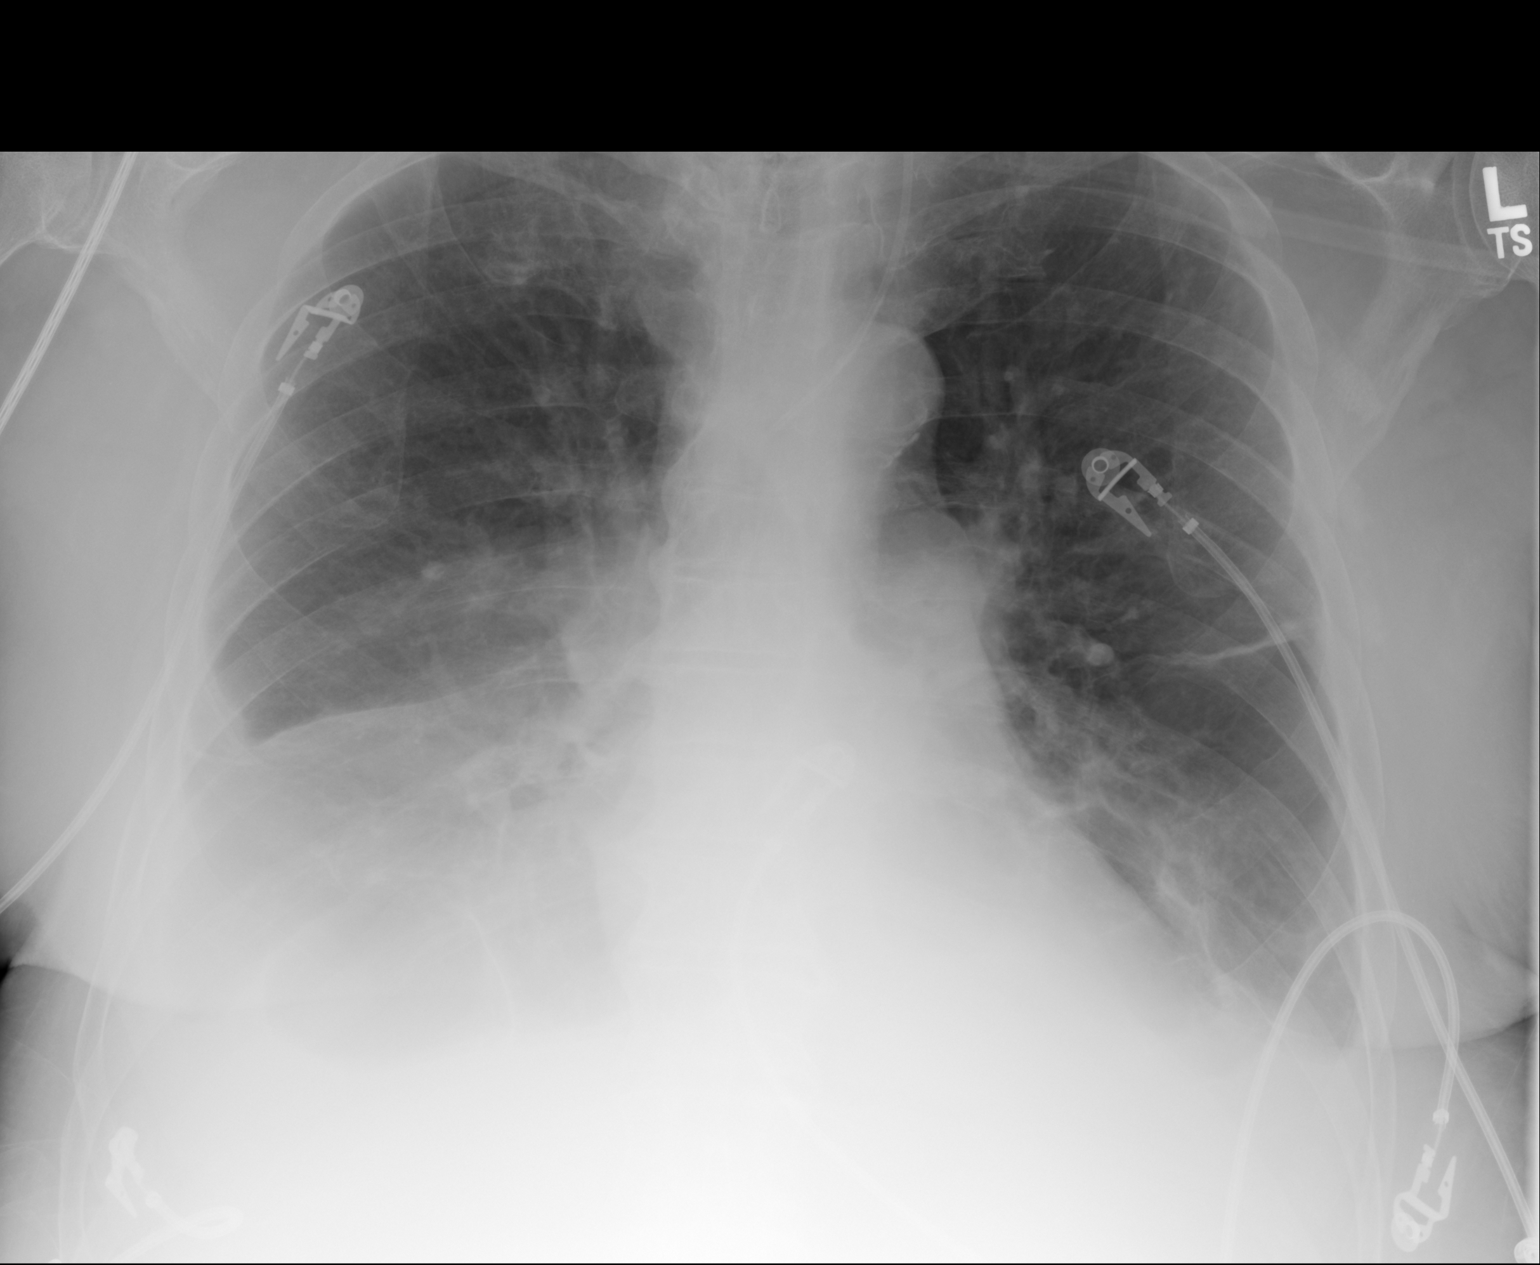

[1 of 1 positions shown; findings below may reference images not displayed]

FINDINGS: Left IJ central venous catheter has been placed with tip in the
midline likely just proximal to the junction of the left
brachiocephalic vein to SVC. No definite pneumothorax. Linear
atelectasis over the left midlung. Continued moderate opacification
over the right mid to lower lung likely a moderate size effusion
with associated atelectasis. Continued small to moderate left
effusion likely with associated atelectasis. Cannot exclude
infection in the mid to lower lungs. Cardiomediastinal silhouette
and remainder of the exam is unchanged.
IMPRESSION: Stable moderate opacification over the right mid to lower lung and
left base likely effusions with associated atelectasis. Cannot
exclude infection.

Left IJ central venous catheter with tip in the midline likely
within the left brachiocephalic vein just proximal to the SVC
junction. No pneumothorax.

## 2014-12-15 NOTE — H&P (Signed)
PATIENT NAME:  Johnathan [name], Johnathan [name] MR#:  [id number] DATE OF BIRTH:  09-28-1934  DATE OF ADMISSION:  04/01/2013  [facility]    CARDIOLOGIST:  Dr. Juliann Pares.    PULMONARY:  Dr. Welton Flakes.  ED REFERRING PHYSICIAN:  Dr. Mayford Knife.   CHIEF COMPLAINT: Generalized weakness, decrease in p.o. intake and acute renal failure.   HISTORY OF PRESENT ILLNESS: The patient is a 80 year old white male with multiple medical problems including gout, history of asthma/COPD with alpha I antitrypsin deficiency, chronic respiratory failure on 3 liters oxygen at all times, hypertension, diabetes, chronic venous insufficiency with hyperlipidemia, atrial fibrillation and GERD who reports that about 2 months ago, he started gaining a significant amount of weight in his lower extremities and they started swelling significantly so he was seen at the [place] and was started on aggressive diuresis. The patient reports that he was started on Lasix and some sort of boosting medication to help with the diuresis and he had significant urinary output and his swelling decreased however, in the process he states that he lost about 50 pounds. The patient reports that he also started having some diarrhea about 2 weeks ago on and off and has persisted until about 2 days ago. It is has resolved. He has not been able to eat for the past 2 days. Since then, the patient comes in with worsening weakness. He went to the urgent care where they noticed that his renal function tests were elevated. His last known creatinine here in September 2012 was normal. Currently it is in the 3 range. The patient does report that he has been told at the [place] that he may have some mild kidney disease. He is not sure what the creatinine is. He also reports that he has been battling a cough that he has developed recently, for which he was treated with Levaquin as an outpatient. He reports that he has chronic shortness of breath and is unchanged. He is on 3 liters  of O2 at all times. He denies any chest pains or palpitations. He denies any abdominal pain, nausea or vomiting. His diarrhea since resolved. He does not report that he has any more swelling in his lower extremity. He denies any blood in the stool.   PAST MEDICAL HISTORY: Significant for  1.  Gout. 2.  History of asthma/COPD on chronic 3 liters of O2 at all times.  3.  Hypertension.  4.  Type 2 diabetes.  5.  Chronic venous insufficiency with history of ulcers in the lower extremity.  6.  Hyperlipidemia.  7.  Atrial fibrillation.  8.  GERD.  9.  BPH based on his medications.  10.  Hypothyroidism.  11.  Seasonal allergies.   PAST SURGICAL HISTORY: Status post appendectomy, tonsillectomy, history of ruptured disk repair.   CURRENT MEDICATIONS: Tylenol 650 q. 4 p.r.n., albuterol nebs 4 times a day as needed, albuterol 2 puffs 4 times a day as needed, aspirin 81 mg 1 tab p.o. daily, budesonide 2 puffs b.i.d., carvedilol 25 mg 1 tab p.o. b.i.d., digoxin 125 mcg 1/2 tab daily, diltiazem 300 daily, finasteride 5 mg daily, fish oil daily, Lasix 80 b.i.d., glipizide 10 daily, lactobacillus 2 tabs 2 times a day, levothyroxine 50 mcg daily, lisinopril 20 one tab p.o. b.i.d., loratadine 10 daily, metolazone 2.5 daily, Metronidazole topical, small amount topically to the ear 2 times a day and mometasone nasal spray 50 mcg 2 sprays at bedtime, multivitamins with mineral daily, niacin 500 two tabs  at bedtime, Novolin N 30 units b.i.d., omeprazole 20 daily, KCl 20 mEq 1 tab p.o. daily, probenecid 500 daily, lovastatin 20 daily, tiotropium 18 mcg daily, vitamin B12 1000 mcg daily, vitamin D3 1000 international units daily.   SOCIAL HISTORY: Lives at home with his wife. He is a former smoker. Denies any alcohol use.   FAMILY HISTORY: Diabetes in maternal grandfather, DVT in mother.  Hypertension in mother and father. Peripheral vascular disease in mother.    REVIEW OF SYSTEMS:  CONSTITUTIONAL: Denies any  fevers. Complains of generalized fatigueness, weakness, has pain in multiple areas of his body. Complains a 50 pound weight loss.  EYES: No blurred or double vision. History of cataracts, which were removed.  ENT: Denies any tinnitus. No ear pain. No hearing loss. Has year-round allergies. No epistaxis. Denies any snoring. No difficulty swallowing.  RESPIRATORY: Complains of productive cough. No wheezing. No hemoptysis. Has chronic COPD on 3 liters of oxygen.   CARDIOVASCULAR: Denies any chest pain, orthopnea. No edema now, has a history of edema. Has history of chronic AFib. No syncope.  GASTROINTESTINAL: No nausea or vomiting. Had diarrhea as described above. No abdominal pain. No hematemesis. No melena. No ulcer. Has chronic GERD. No jaundice. No rectal bleeding.  GENITOURINARY: Denies any dysuria, hematuria, renal calculus or frequency. Has history of BPH.  ENDOCRINE:  Denies any polyuria, nocturia.  SKIN: Denies any acne. No rash. No changes in mole, hair or skin.  MUSCULOSKELETAL: Has pain related to osteoarthritis. Has gout.  NEUROLOGIC: No numbness. No CVA. No TIA. No seizures.  PSYCHIATRIC: No anxiety. No insomnia. No ADD. No OCD.   PHYSICAL EXAMINATION:  VITAL SIGNS:  Temperature 97.1, pulse 92, respirations 20, blood pressure 162/56, O2 is 98%.  GENERAL: The patient is a chronically ill-appearing male, currently not in any acute distress.  HEENT: Head atraumatic, normocephalic. Pupils equally round, reactive to light and accommodation. There is no conjunctival pallor. No scleral icterus. Nasal exam shows no drainage or ulceration. Oropharynx: Clear without any exudate.  NECK: Supple without JVD.  CARDIOVASCULAR: Irregularly irregular rhythm. No murmurs, rubs, clicks or gallops. PMI is not displaced.  LUNGS: Clear to auscultation bilaterally without any rales, rhonchi, wheezing.  ABDOMEN: Soft, nontender, nondistended. Positive bowel sounds x4. There is no hepatosplenomegaly.   EXTREMITIES: Chronic discoloration of his lower extremities. There is no edema.  NEUROLOGICAL: Cranial nerves II through XII grossly intact. No focal deficits.  PSYCHIATRIC: Not anxious or depressed.  LYMPHATICS:  No lymph nodes palpable.    LABORATORY EVALUATIONS: Glucose 109, BUN 93, creatinine 3.36, sodium 137, potassium 4.2, chloride 104, CO2 is 24, calcium 9.3. LFTs: Total protein 7.8, albumin 3.0, bilirubin total 0.7, alkaline phosphatase 90, AST 41. WBC 9.7, hemoglobin 12.6, platelet count is 73.   ASSESSMENT AND PLAN: The patient is a 80 year old white male with multiple medical problems who presents with generalized weakness, noted to have elevated creatinine and BUN.   1.  Generalized weakness, likely due to acute renal failure. At this time, will provide him with IV fluids. Work-up his renal failure.  2.  Acute renal failure, likely due to overdiuresis and diarrhea. At this time, we will give him IV fluids. We will get a renal ultrasound, check a urinalysis. Obtain his last renal function from [facility] [place]. I will also ask Nephrology to come see the patient. We will hold his Lasix, metolazone, lisinopril, avoid any other nephrotoxins.  3.  History of chronic obstructive pulmonary disease, on chronic oxygen, We will continue oxygenation.  We will continue his inhalers as taking at home and nebulizers as taking and  continue budesonide b.i.d.  4.  Atrial fibrillation. Continue digoxin, Cardizem, Coreg and aspirin. We will monitor him on telemetry.  5.  Hypothyroidism. Continue levothyroxine.  6.  Hypertension. We will hold lisinopril and monitor his blood pressure.  7.  Diabetes. We will continue sliding scale. Continue his Humulin that he is taking at home.  8.  Thrombocytopenia which appears to be new. He will need to have this followed up as an outpatient. Probenecid, I believe can also cause thrombocytopenia, so we will hold this medication. He will need further oncology outpatient  evaluation.  NOTE: 50 minutes spent on this H and P.   ____________________________ Lacie ScottsShreyang H. Allena KatzPatel, MD shp:dp D: 04/01/2013 16:58:18 ET T: 04/01/2013 17:13:44 ET JOB#: [id number]  cc: Sephira Zellman H. Allena KatzPatel, MD, <Dictator> Charise CarwinSHREYANG H Tell Rozelle MD ELECTRONICALLY SIGNED 04/04/2013 10:34

## 2014-12-15 NOTE — Discharge Summary (Signed)
PATIENT NAME:  Johnathan [name], PONDS MR#:  [id number] DATE OF BIRTH:  February 26, 1935  DATE OF ADMISSION:  04/01/2013 DATE OF DISCHARGE:  04/04/2013  PRESENTING COMPLAINT: Diarrhea and weakness.   DISCHARGE DIAGNOSES: 1.  Acute on chronic renal failure secondary to dehydration from diarrhea, resolved. Creatinine on admission was 3.14, on discharge was 1.66.  2.  Chronic atrial fibrillation.  3.  Chronic obstructive pulmonary disease on home oxygen.  4.  Acute on chronic renal failure.   CONDITION ON DISCHARGE: Fair.   CODE STATUS: FULL CODE.   [facility]: [facility], [facility] at [facility] [place] clinic.   MEDICATIONS: 1.  Finasteride 5 mg daily.  2.  Vitamin D3 1000 international units daily.  3.  Fish oil daily.  4.  Crestor 20 mg at bedtime.  6.  Mometasone nasal spray 50 mcg per inhalations 2 sprays once a day at bedtime.  7.  Albuterol Nebs q. 4 as needed.  8.  Symbicort 160/4.5,  two puffs b.i.d.  9.  Carvedilol 25 mg 1 tablet b.i.d.  10.  Digoxin 125 mcg 1/2 tablet p.o. daily.  11.  Glipizide 10 mg daily.  12.  Novolin 30 units b.i.d.  13.  Lactobacillus acidophilus orally 2 tablets b.i.d.  14.  Levothyroxine 50 mcg daily.  15.  Loratadine 10 mg daily.  16.  Metolazone 2.5 mg daily for fluid and blood pressure.  17.  Lasix 40 mg daily.  18.  Metronidazole topical cream to affected area b.i.d.  19.  Niacin 500 mg 2 tablets at bedtime.  20.  Omeprazole 20 mg daily.  21.  K-Dur 20 mEq p.o. daily.  22.  Probenecid 500 mg daily.  23.  Tiotropium HandiHaler 1 capsule inhalation daily.  24.  Tylenol 325 mg 2 tablets 4 times a day as needed.  25.  Aspirin 81 mg daily.  26.  Vitamin B12 1000 mcg daily.  27.  Multivitamin with minerals p.o. daily.  28.  Lisinopril 10 mg b.i.d.   STOP TAKING THE FOLLOWING MEDICATIONS: 1.  Cardizem due to low BP. 2.  Decrease dose of Lasix from 40 mg 2 tablets twice a day two of 40 mg daily.  3.  Decrease the dose of lisinopril due to her low  blood pressure and elevated creatinine.   FOLLOWUP: With your PA, Lucille Passy at [facility].   LABORATORY DATA AT DISCHARGE:  1.  Creatinine is 1.66.  2.  White count is 6.4, hemoglobin and hematocrit is 11.3 and 33.5.  3.  Platelet count is 59,000.  4.  Urinalysis negative for urinary tract infection.  5.  Clostridium difficile stool for C-diff negative.  6.  Stool comprehensive negative.  7.  Creatinine on admission was 3.36.  8.  UA negative for UTI.  9.  Ultrasound kidneys shows normal renal ultrasound for age.   BRIEF SUMMARY OF HOSPITAL COURSE: Mr. Johnathan [name] is a pleasant 80 year old Caucasian gentleman with history of chronic obstructive pulmonary disease, end-stage on home oxygen, hypertension and chronic atrial fibrillation. Comes in with generalized weakness, diarrhea and elevated BUN and creatinine. He was admitted with:   1.  Acute on chronic renal failure secondary to diarrhea with generalized weakness. He was started on IV fluids. Creatinine was 3.36, came down to 1.6 prior to discharge. The patient was tolerating p.o. diet and liquids well and IV fluids were then discontinued.  2.  Acute renal failure, likely due to over diuresis with Lasix and diarrhea. The patient's Lasix dose was  decreased from 80 mg b.i.d. to 40 mg daily. Creatinine is down to 1.66.  3.  COPD, on home oxygen. Inhalers and home nebulizers were continued.  4.  Chronic AFib. Continue digoxin, Coreg and the patient is on aspirin. Blood pressure remained relatively low and Cardizem was discontinued.  5.  Hypothyroidism. On Synthroid.  6.  Type 2 diabetes continued home dose insulin and glipizide.  7.  Thrombocytopenia, which appears to be new, could be due to possible diarrhea which could be viral. We will have the patient follow-up levels as outpatient with primary care physician. 8.  Hospital stay otherwise remained stable. The patient remained a full code.   TIME SPENT: 40 minutes.     ____________________________ Wylie HailSona A. Allena KatzPatel, MD sap:dp D: 04/05/2013 07:04:23 ET T: 04/05/2013 08:00:01 ET JOB#: [id number]  cc: Rion Schnitzer A. Allena KatzPatel, MD, <Dictator> Lucille PassyGeorge Kroner, PA [facility] [address], [place]  [phone]  Willow OraSONA A India Jolin MD ELECTRONICALLY SIGNED 04/15/2013 5:41

## 2014-12-15 NOTE — Consult Note (Signed)
History of Present Illness:  Reason for Consult Lung mass concerning for malignancy.   HPI   Patient is a 80 year old male who was admitted to the hospital after failing 3 rounds of antibiotics without much improvement in pneumonia.  Further workup with CT scan revealed a possible mass in patient's right lung.  Currently he is short of breath, but otherwise feels well.  He denies any chest pain, cough, or hemoptysis.  He has no neurologic complaints.  He denies any fevers.  He has a good appetite and denies weight loss.  He denies any nausea, vomiting, constipation, or diarrhea.  He has no urinary complaints.  Patient otherwise feels well and offers no further specific complaints.  PFSH:  Additional Past Medical and Surgical History atrial fibrillation, COPD, gout, asthma, hypertension, diabetes, emphysema, tonsillectomy, appendectomy.  Family history: COPD, hypertension  Social history: Positive tobacco, quit over 40 years ago.  Patient denies alcohol.   Review of Systems:  Performance Status (ECOG) 1   Review of Systems   As per HPI. Otherwise, 10 point system review was negative.   NURSING NOTES: **Vital Signs.:   12-Dec-14 04:00   Vital Signs Type: Routine   Temperature Temperature (F): 97.9   Celsius: 36.6   Temperature Source: oral   Pulse Pulse: 57   Respirations Respirations: 20   Systolic BP Systolic BP: 130   Diastolic BP (mmHg) Diastolic BP (mmHg): 53   Mean BP: 78   Pulse Ox % Pulse Ox %: 95   Pulse Ox Activity Level: At rest   Oxygen Delivery: 3L   Physical Exam:  Physical Exam General: Well-developed, well-nourished, no acute distress. Eyes: Pink conjunctiva, anicteric sclera. HEENT: Normocephalic, moist mucous membranes, clear oropharnyx. Lungs: Clear to auscultation bilaterally. Heart: Regular rate and rhythm. No rubs, murmurs, or gallops. Abdomen: Soft, nontender, nondistended. No organomegaly noted, normoactive bowel  sounds. Musculoskeletal: No edema, cyanosis, or clubbing. Neuro: Alert, answering all questions appropriately. Cranial nerves grossly intact. Skin: No rashes or petechiae noted. Psych: Normal affect. Lymphatics: No cervical, calvicular, axillary or inguinal LAD.    Lipitor: Unknown  Simvastatin: Unknown    Lasix 40 mg oral tablet: 2 tab(s) orally 2 times a day, Status: Active, Quantity: 30, Refills: None   finasteride 5 mg oral tablet: 1 tab(s) orally once a day, Status: Active, Quantity: 0, Refills: None   budesonide-formoterol 160 mcg-4.5 mcg/inh inhalation aerosol: 2 puff(s) inhaled 2 times a day, Status: Active, Quantity: 0, Refills: None   glipiZIDE 10 mg oral tablet: 1 tab(s) orally once a day (in the morning) for diabetes, Status: Active, Quantity: 0, Refills: None   loratadine 10 mg oral tablet: 1 tab(s) orally once a day for nasal congestion, Status: Active, Quantity: 0, Refills: None   niacin 500 mg oral tablet: 2 tab(s) orally once a day (at bedtime) after a low-fat snack, Status: Active, Quantity: 0, Refills: None   omeprazole 20 mg oral delayed release capsule: 1 cap(s) orally once a day for acid reflux, Status: Active, Quantity: 0, Refills: None   potassium chloride 20 mEq oral tablet, extended release: 1 tab(s) orally once a day for potassium, Status: Active, Quantity: 0, Refills: None   probenecid 500 mg oral tablet: 1 tab(s) orally once a day, Status: Active, Quantity: 0, Refills: None   tiotropium 18 mcg inhalation capsule: 1 each inhaled once a day, Status: Active, Quantity: 0, Refills: None   Aspirin Enteric Coated 81 mg oral delayed release tablet: 1 tab(s) orally once a day, Status: Active, Quantity:  0, Refills: None   multivitamin with minerals: 1 tab(s) orally once a day, Status: Active, Quantity: 0, Refills: None   levothyroxine 88 mcg (0.088 mg) oral capsule: 1 cap(s) orally 2 times a day, Status: Active, Quantity: 0, Refills: None   lisinopril 40 mg  oral tablet: 0.5 tab(s) orally once a day, Status: Active, Quantity: 60, Refills: None   Augmentin 875 mg-125 mg oral tablet: 1 tab(s) orally every 12 hours, Status: Active, Quantity: 0, Refills: None   Theo-24 100 mg/24 hours oral capsule, extended release: 1 cap(s) orally once a day, Status: Active, Quantity: 0, Refills: None   albuterol CFC free 90 mcg/inh inhalation aerosol: 2 puff(s) inhaled every 4 to 6 hours, As Needed - for Shortness of Breath, Status: Active, Quantity: 0, Refills: None   Cardizem LA 300 mg/24 hours oral tablet, extended release: 1 tab(s) orally once a day, Status: Active, Quantity: 0, Refills: None   NovoLIN 70/30 human recombinant 70 units-30 units/mL subcutaneous suspension: 20 unit(s) subcutaneous once a day in the am, Status: Active, Quantity: 0, Refills: None   carvedilol 25 mg oral tablet: 0.5 tab(s) orally 2 times a day, Status: Active, Quantity: 0, Refills: None   digoxin 125 mcg (0.125 mg) oral tablet: 1 tab(s) orally once a day, Status: Active, Quantity: 0, Refills: None  Laboratory Results: Routine Coag:  11-Dec-14 04:03   Prothrombin 14.0  INR 1.1 (INR reference interval applies to patients on anticoagulant therapy. A single INR therapeutic range for coumarins is not optimal for all indications; however, the suggested range for most indications is 2.0 - 3.0. Exceptions to the INR Reference Range may include: Prosthetic heart valves, acute myocardial infarction, prevention of myocardial infarction, and combinations of aspirin and anticoagulant. The need for a higher or lower target INR must be assessed individually. Reference: The Pharmacology and Management of the Vitamin K  antagonists: the seventh ACCP Conference on Antithrombotic and Thrombolytic Therapy. Chest.2004 Sept:126 (3suppl): L7870634. A HCT value >55% may artifactually increase the PT.  In one study,  the increase was an average of 25%. Reference:  "Effect on Routine and Special  Coagulation Testing Values of Citrate Anticoagulant Adjustment in Patients with High HCT Values." American Journal of Clinical Pathology 2006;126:400-405.)  Routine Hem:  11-Dec-14 04:03   WBC (CBC) 5.9  RBC (CBC)  3.09  Hemoglobin (CBC)  9.7  Hematocrit (CBC)  28.8  Platelet Count (CBC)  135  MCV 93  MCH 31.3  MCHC 33.5  RDW  16.5  Neutrophil % 66.2  Lymphocyte % 19.8  Monocyte % 12.0  Eosinophil % 1.4  Basophil % 0.6  Neutrophil # 3.9  Lymphocyte # 1.2  Monocyte # 0.7  Eosinophil # 0.1  Basophil # 0.0 (Result(s) reported on 04 Aug 2013 at 07:15AM.)   Radiology Results: CT:    11-Dec-14 18:13, CT Chest With Contrast  CT Chest With Contrast   REASON FOR EXAM:    f/u lung mass not seen on bronchoscopy  COMMENTS:       PROCEDURE: CT  - CT CHEST WITH CONTRAST  - Aug 04 2013  6:13PM     CLINICAL DATA:  Follow-up lung mass not visualized on bronchoscopy    EXAM:  CT CHEST WITH CONTRAST    TECHNIQUE:  Multidetector CT imaging of the chest was performed during  intravenous contrast administration.    CONTRAST:  75 mL Isovue 370 IV  COMPARISON:  07/28/2013    FINDINGS:  Rounded 3.7 x 2.6 cm mass like opacity in  the central right middle  lobe (series 2/ image 43), favored to reflect atelectasis and/or  consolidation given vessels coursing through the lesion. However,  there is truncation of the right middle lobe bronchus (series 3/  image 39). Lesion previously measured 4.1 x 3.0 cm when measured in  a similar fashion (series 7/image 43 of the prior study), and  therefore may have mildly improved.    Mild patchy opacity in the lateral left upper lobe (series 3/ image  37), new, favored to reflect atelectasis. Pneumonia is not entirely  excluded.  Moderate bilateral pleural effusions, mildly increased, with  associated lower lobe opacities, favored to reflect compressed  atelectasis.    Underlying moderate centrilobular and paraseptal emphysematous  changes. No  pneumothorax.    Visualized thyroid is unremarkable.    The heart is normal in size. No pericardial effusion. Coronary  atherosclerosis. Atherosclerotic calcifications of the aortic arch.    Small mediastinal lymph nodes, including a 9 mm short axis right  paratracheal node (series 2/ image 25) and a 10 mm short axis AP  window node (series 2/ image 27), likely reactive. No suspicious  hilar or axillary lymphadenopathy.    Visualized upper abdomen is notable for cirrhosis and numerous  layering gallstones.    Degenerative changes of the visualized thoracolumbar spine.     IMPRESSION:  Rounded 3.7 x 2.6 cm mass like opacity in the central right middle  lobe, favored to reflect atelectasis and/or consolidation, possibly  mildly decreased. Consider short-term follow-up CT chest in  approximately 3-4 weeks. If persistent, consider PET-CT.    Mild patchy opacity in the lateral left upper lobe, new, favored to  reflect atelectasis. Pneumonia is the not entirely excluded.  Moderate bilateral pleural effusions, mildlyincreased. Associated  lower lobe opacities, favored to reflect compressive atelectasis.    Additional stable ancillary findings as above.      Electronically Signed    By: Charline Bills M.D.    On: 08/04/2013 18:34         Verified By: Charline Bills, M.D.,   Assessment and Plan: Impression:   Lung mass suspicious for underlying malignancy. Plan:   1.  Lung mass: Patient had bronchoscopy yesterday and cytology results are currently pending.  If negative, will have to decide to either attempt CT-guided biopsy or repeat CT scan in 4 weeks to assess for interval change.  A PET scan would not be helpful at this point since it could positive in infection and malignancy.  No further intervention is needed.  Will comment further once pathology results are finalized. consult, will follow.  Electronic Signatures: Gerarda Fraction (MD)  (Signed 12-Dec-14  13:38)  Authored: HISTORY OF PRESENT ILLNESS, PFSH, ROS, NURSING NOTES, PE, ALLERGIES, HOME MEDICATIONS, LABS, OTHER RESULTS, ASSESSMENT AND PLAN   Last Updated: 12-Dec-14 13:38 by Gerarda Fraction (MD)

## 2014-12-15 NOTE — H&P (Signed)
PATIENT NAME:  Michaele OfferHILL, Shun A MR#:  [id number] DATE OF BIRTH:  1934-12-07  DATE OF ADMISSION:  07/28/2013  PRIMARY CARE PHYSICIAN: Freda MunroSaadat Khan, MD   CHIEF COMPLAINT: Shortness of breath.   The patient was sent from PCP office due to pneumonia.   HISTORY OF PRESENT ILLNESS: This is a 80 year old male with a history of COPD, on oxygen, chronic respiratory failure, who has been on Augmentin as an outpatient for a bronchitis. He had followup with his primary care physician. They repeated a chest x-ray which looked like a progression of a pneumonia and as per Dr. Milta DeitersKhan's note there is a concern for something more sinister and Dr. Welton FlakesKhan recommended that the patient be admitted for IV treatment.   The patient says that he does feel more fatigued and short of breath, but his shortness of breath is at baseline. He does have a productive cough with whitish sputum.   REVIEW OF SYSTEMS: CONSTITUTIONAL: No fever. Positive fatigue and weakness.  SKIN: No rashes or lesions. EYES: No blurred vision or double vision.  NOSE AND SINUSES: No postnasal drip, epistaxis, or sinus trouble. No sore throat or hoarseness.  NECK: No swollen glands.  PULMONARY: He has a productive cough. He does have some mild dyspnea on exertion and some mild shortness of breath.  CARDIOVASCULAR: He has chronic lower extremity edema. Positive dyspnea on exertion. No chest pain, orthopnea, or PND.  NEUROLOGIC: No history of CVA, TIA, or seizures.  PSYCHIATRIC: No history of anxiety or depression. HEMATOLOGIC: No history of easy bruising.  LYMPHATIC: He does have some left lymph node enlargement.   PAST MEDICAL HISTORY:  1.  COPD with chronic respiratory failure, O2 dependent.  2.  Congestive heart failure, not clear if this is systolic or diastolic.  3.  Hypertension.  FAMILY HISTORY: Positive for COPD and hypertension.   SOCIAL HISTORY: The patient quit smoking over 40 years ago. He lives with his wife and his son. No IV drug use  or alcohol.   MEDICATIONS: 1.  Augmentin 1 tablet b.i.d.  2.  Theophylline 100 mg daily.  3.  Albuterol HFA.  4.  Cardizem 300 mg daily.  5.  Coreg 12.5 mg b.i.d.  6.  Digoxin 125 mcg daily.  7.  Aspirin 81 mg daily.  8.  Finasteride 5 mg daily.  9.  Glipizide 10 mg daily.  10.  Lasix 40 mg b.i.d.  11.  Lisinopril 40 mg half tablet daily.  12.  Multivitamin 1 tablet daily.  13.  Niacin 500 mg 2 tablets at bedtime.  14.  Novolin 70/30 20 units in the a.m.  15.  Omeprazole 20 mg daily.  16.  KCl 20 mEq daily.  17.  Probenecid 500 mg daily.  18.  Spiriva 18 mcg daily.  19.  Symbicort 2 puffs b.i.d.  20.  Synthroid 88 mcg daily.  21.  Loratadine 10 mg daily.   ALLERGIES: LIPITOR AND SIMVASTATIN.   PHYSICAL EXAMINATION: VITAL SIGNS: Temperature 97.7, pulse 80, respirations 20, blood pressure 131/57.  GENERAL: The patient is alert and oriented, not in acute distress, appears his stated age.  HEENT: Head is atraumatic. Pupils are round and reactive. Sclerae anicteric. Mucous membranes are moist. Oropharynx is clear.  NECK: Supple. He does have some mild left cervical lymphadenopathy.  CARDIOVASCULAR: Regular rate and rhythm. No murmurs, gallops, or rubs. PMI is not displaced.  LUNGS: He has crackles at the left lung base. No wheezing or dullness to percussion. Normal chest expansion.  EXTREMITIES: He has 2+ pitting edema. He has some TED hose on.  SKIN: Without rash or lesions. NEUROLOGIC:  Cranial nerves II through XII are intact. There is no focal deficit.  MUSCULOSKELETAL: No pathology to digits or nails. The patient is able to move all extremities.   LABORATORY DATA: None at this time.  CXR:LUL PNA  ASSESSMENT AND PLAN: A 80 year old male who was treated with Augmentin as an outpatient who apparently has a worsening left lung base pneumonia and atelectasis.  1.  Community-acquired pneumonia. The patient was on Augmentin which is not a good medication for pneumonia so I have  treated him with Levaquin 750 mg daily. Dr. Welton Flakes will come in and consult the patient. He also recommended CT of chest to evaluate for underlying neoplasm, which I have ordered. We will first check a BMP to assure the creatinine is normal before the CT scan.  2.  History of chronic obstructive pulmonary disease with chronic respiratory failure. This seems to be stable at this time. Will continue his outpatient medications.  3.  History of hypertension. We will continue the patient's medications.  4.  Diabetes. We will continue glipizide, sliding insulin, and ADA diet.  5.  Benign prostatic hypertrophy. Continue finasteride.   CODE STATUS: The patient is DNR status.  TIME SPENT: Approximately 40 minutes.  ____________________________ Janyth Contes. Juliene Pina, MD spm:sb D: 07/28/2013 14:13:36 ET T: 07/28/2013 14:40:03 ET JOB#: [id number]  cc: Nayali Talerico P. Juliene Pina, MD, <Dictator> Yevonne Pax, MD Janyth Contes Harvie Morua MD ELECTRONICALLY SIGNED 07/28/2013 15:10

## 2014-12-16 NOTE — H&P (Signed)
PATIENT NAME:  Johnathan [name], Johnathan [name] MR#:  [id number] DATE OF BIRTH:  [birth year]  DATE OF ADMISSION:  10/07/2013  PRIMARY CARE PHYSICIAN: [facility].  PULMONOLOGIST: Dr. Freda MunroSaadat Khan.   CARDIOTHORACIC SURGEON: Dr. Thelma Bargeaks.   CHIEF COMPLAINT: Increasing shortness of breath, was found to be hypoxic with sats of 80% on his 2.5 liter nasal cannula oxygen at home.   HISTORY OF PRESENT ILLNESS: Johnathan [name] is [name] with multiple medical problems including alpha-1 antitrypsin deficiency with history of chronic emphysema, on home oxygen, with chronic multiple lung infections in the past. Recently admitted and discharged 22nd of January after he was diagnosed with possible healthcare-associated pneumonia, completed [name]. Went to see Dr. Orlie DakinFinnegan for possible lung mass and pleural effusion, who was referred to Dr. Thelma Bargeaks. Saw Dr. Thelma Bargeaks on February 5, and his work-up did not seem to be suggestive for lung mass. The patient lives at home. He has very limited activity due to his overall deconditioning from past several admissions and has home health who follows up at home with him. He comes to the Emergency Room today feeling very short of breath, appears very weak, fatigued. Currently, he states his shortness of breath is improved. Denies any productive phlegm or any fever. He did have subjective feeling cold yesterday, but wife told he used [name] to cover him up. He received [name], Levaquin, and Zosyn in the Emergency Room. The patient is being admitted for acute hypoxic respiratory failure.   PAST MEDICAL HISTORY: 1.  CHF.  2.  Chronic [name]. fib.  3.  COPD.  4.  Gout.  5.  Asthma/emphysema with alpha-1 antitrypsin deficiency.  6.  Hypertension.  7.  Diabetes.  8.  Tonsillectomy.  9.  Appendectomy.  10.  Right-sided pleural effusion with recent thoracentesis.   ALLERGIES: LIPITOR AND SIMVASTATIN.   SOCIAL HISTORY: Lives at home with wife. Home  health follows. He quit smoking 40 years ago. Denies alcohol or illegal drug use.   FAMILY HISTORY: Positive for COPD and hypertension.  REVIEW OF SYSTEMS:   CONSTITUTIONAL: Positive for fatigue, weakness.  EYES: No blurred or double vision, redness or inflammation.  ENT: No tinnitus, ear pain, hearing loss or postnasal drip.  RESPIRATORY: Positive for cough, COPD, dyspnea.  CARDIOVASCULAR: No chest pain, orthopnea, edema. Positive for dyspnea on exertion. Positive for hypertension.  GASTROINTESTINAL: No nausea, vomiting, diarrhea, abdominal pain, rectal bleeding.  GENITOURINARY: No dysuria, hematuria, renal calculus or frequency.  ENDOCRINE: No polyuria, nocturia or thyroid problems.  HEMATOLOGIC: No anemia or easy bruising.  SKIN: No acne, rash or arthritis.  NEUROLOGIC: No CVA, TIA. Positive for weakness.  PSYCHIATRIC: No anxiety or depression.   All other systems reviewed and negative.   PHYSICAL EXAMINATION: GENERAL: The patient is awake. He is alert, oriented x 3. Mild distress due to shortness of breath. Appears overall very weak and fatigued.  HEENT: Atraumatic, normocephalic. Pupils: PERRLA. EOM intact. Oral mucosa is dry.  NECK: Supple. No JVD. No carotid bruits.  RESPIRATORY: Decreased breath sounds in the bases. Distant breath sounds. No rhonchi, rales or audible wheezing heard. The patient does have some coarse breath sounds. He is not in respiratory distress and no use of accessory muscles.  CARDIOVASCULAR: Both the heart sounds are normal. Rate, rhythm regular. PMI not lateralized. Chest  nontender. Good pedal pulses, good femoral pulses. No lower extremity edema.  ABDOMEN: Soft, benign, nontender. No organomegaly. Positive bowel sounds.  NEUROLOGIC: Grossly intact  cranial nerves II through XII. There is subjective weakness present. No focal motor weakness present.  SKIN: Warm and dry.   IMAGING: CT angiography of the chest shows no CT evidence of aortic dissection or  rupture. No intra-abdominal or pelvic pathology. Moderate to large pleural effusions. Ascites and abdominal soft tissue edema compatible with third spacing or hypoproteinemia. This may be due to in part presence of hepatic cirrhosis. Improving aeration of the right lower lobe with persistent dependent lower lobe predominant airspace consolidation, most likely representing rounded atelectasis. Gallstones with mild gallbladder distention.   LABORATORY DATA: White count is 11.3; hemoglobin and hematocrit are 9.9 and 31.8; platelet count is 168; MCV is 94. Glucose is 66; BUN is 19; creatinine is 1.12; sodium 129; potassium is 4.6; chloride is 92; bicarbonate is 36. Bilirubin is 3.1; SGPT is 66; SGOT is 164; alkaline phosphatase is 398. Troponin is 0.02. TSH is 6.2. Free thyroxine is 1.47. Theophylline is 3.1. Digoxin is 0.98. ABG: pH is 7.48; pCO2 is 48, pO2 of 67. Sats 95% on 2.5 liters nasal cannula.   ASSESSMENT AND PLAN: [name] 80 year old Johnathan [name] with history of chronic respiratory failure secondary to alpha-1 antitrypsin deficiency, history of recurrent pneumonias along with history of hypertension and diabetes, comes to the Emergency Room with increasing shortness of breath. Was found to have saturations down in the 80s on 2.5 liters, his home dose nasal cannula oxygen. He is going to be admitted with:  1.  Acute on chronic hypoxic respiratory failure secondary to chronic obstructive pulmonary disease exacerbation. The patient will be placed on the floor. Will give him IV Solu-Medrol around-the-clock, continue nebulizer treatment, inhalers, and oxygen.  2.  Recurrent pneumonias, with most recent admission in December 2014 and another admission in January 2015. The patient completed broad-spectrum antibiotics on both the hospitalizations. At this time, the patient does not appear to be septic. His chest x-ray and CT chest findings appear to be more on the chronic side, along with atelectasis. In fact, his CT  chest shows some improvement in the airspace disease. His white count is mildly elevated at 11.3. He is afebrile. The patient already received vancomycin, Levaquin, and Zosyn. I will hold off on further antibiotics until indicated. This is more likely respiratory failure than true pneumonia.  3.  Type 2 diabetes. Will continue sliding scale insulin and his home insulin/medication. 4.  Hypertension. The patient is [name] little bit on the hypotensive side. I will hold off on blood pressure medications at this time. Resume once patient's blood pressure is stable.  5.  History of congestive heart failure, systolic, appears to be stable at this time. With his chronic lung changes, I am sure he has some fluid build-up; however, I will hold off on any Lasix at this time. Will continue to monitor and give Lasix as needed.  6.  Generalized weakness, deconditioning with recurrent admissions to the hospital. I did have [name] lengthy discussion with the patient, patient's daughter and wife, and they are agreeable with palliative care consultation. Will have palliative care team see the patient on Monday.  7.  Care management for discharge planning.  8.  Deep vein thrombosis prophylaxis: Will give subcutaneous heparin.   TIME SPENT: 55 minutes.    ____________________________ Wylie Hail Allena Katz, MD sap:jcm D: 10/07/2013 18:30:15 ET T: 10/07/2013 19:22:25 ET JOB#: [id number]  cc: Allyn Bertoni [name]. Allena Katz, MD, <Dictator> [facility] Willow Ora MD ELECTRONICALLY SIGNED 10/13/2013 17:21

## 2014-12-16 NOTE — Consult Note (Signed)
PATIENT NAME:  Johnathan [name], Johnathan [name] MR#:  [id number] DATE OF BIRTH:  [birth year]  DATE OF CONSULTATION:  10/11/2013  REQUESTING PHYSICIAN:  Dr. Posey Pronto.  CONSULTING PHYSICIAN:  Cheral Marker. Ola Spurr, MD  REASON FOR CONSULTATION: Enterococcal bacteremia.   HISTORY OF PRESENT ILLNESS: This is a pleasant 80 year old gentleman who unfortunately has alpha-1 antitrypsin deficiency and has respiratory chronic failure from this. He has had 3 to 4 admissions in the last year with recurrent respiratory issues. Most recently, he had a pseudomonal bacteremia in mid-January presumably from a pulmonary source. He underwent oral antibiotic therapy when he was discharged January 22.   He was readmitted on February 13 with worsening respiratory failure, subjective fever at home. He had blood cultures done and they are now growing two out of two enterococcal resistant to ampicillin but sensitive to vancomycin and linezolid.   Per the patient and his wife, he had recovered from his most recent pneumonia and had been off antibiotics for about a week when he developed the respiratory symptoms as well as a fever to 101 at home. He did not have any central lines, ports, or PICC lines in. He did have some urinary symptoms including some mild burning with urination. He has not had any diarrhea but does have some constipation and has to strain.   Since admission, the patient has improved, has been afebrile and hemodynamically stable.   PAST MEDICAL HISTORY:  1.  CHF. 2.  chronic respiratory failure from COPD exacerbation.  3.  Chronic AFib.  4.  Gout.  5.  Hypertension.  6.  Diabetes.  7.  Tonsillectomy.  8.  Appendectomy.  9.  Recent right-sided pleural effusion, thoracentesis.   SOCIAL HISTORY: The patient lives at home with his wife. He is retired from Teaching laboratory technician work. He quit smoking after only a few years and this was over 40 years ago. He does not drink alcohol.   FAMILY HISTORY: Positive for COPD and hypertension. His  sister has alpha-1 antitrypsin deficiency as well.   ALLERGIES: LIPITOR AND SIMVASTATIN.   ANTIBIOTICS SINCE ADMISSION: Include Zosyn which he received on February 12, vancomycin begun February 13.   OTHER MEDICATIONS: Include acetaminophen, aspirin, Coreg, digoxin, finasteride, glipizide, sliding-scale insulin, Synthroid, Claritin, multivitamin, niacin, Zofran, pantoprazole, potassium chloride, probenecid, Crestor, theophylline, Spiriva, Lasix, Ambien, prednisone.   REVIEW OF SYSTEMS: Eleven systems reviewed and negative except as per HPI.   PHYSICAL EXAMINATION:  VITAL SIGNS: T-max over the last 24 hours is 98.2. He has no documented fever since admission. Pulse 89, blood pressure 134/66, respirations 20, sat 99% on 2 liters.  GENERAL: He is thin. He is chronically ill appearing.  HEENT: Pupils equal, round and reactive to light and accommodation. Extraocular movements are intact. Sclerae are anicteric.   OROPHARYNX: Mucous membranes are dry. No thrush or lesions.  NECK: Supple. No lymphadenopathy.  HEART: Regular.  LUNGS: Have some mild expiratory wheeze. Prolonged expiratory phase but good air movement.  ABDOMEN: Distended and nontender. He is obese in the abdomen.  EXTREMITIES: No clubbing, cyanosis or edema.  NEUROLOGIC: Alert and oriented x3. Grossly nonfocal neuro exam.   LABORATORY DATA: Labs are reviewed. Blood cultures from admission on February 13 are growing Enterococcus faecium and resistant to ampicillin, sensitive to vancomycin and linezolid. Second bottle was growing gram-positive cocci yet to be identified. Followup blood culture, February 14, was no growth to date x2. Pleural fluid was negative from February 15. Blood cultures from January grew pseudomonas 2 types of  species.  White count on admission was 11.3, currently is 4.0. Hemoglobin 9.4, platelets 134. Renal function shows a GFR of 51. LFTs showed albumin low at 2.0, typically up at 3.1, alk phos 398, AST 164 and  ALT 66.   CT chest from February 13 reveals no evidence of dissection or embolism. There is moderate to large pleural effusion. There is ascites and soft-tissue edema. This may be some evidence of hepatic cirrhosis. There is improving aeration of the right lower lobe with persistent bilateral dependent lower lobe predominant airspace consolidation, most likely representing rounded atelectasis. There is a nodular contour to the liver suggesting cirrhosis.   IMPRESSION: A 80 year old gentleman with progressive declining health over the last year who has alpha-1 antitrypsin deficiency with chronic obstructive pulmonary disease on home oxygen as well as evidence on lab work and a CT of possible cirrhosis. He now has enterococcal bacteremia. Clinically he is responding to treatment for his acute respiratory failure and is also cleared his  culture and is afebrile on the vancomycin. Unfortunately the enterococcus is resistant to ampicillin.   It is unclear where this enterococcal bacteria came from.  It is usually  known to be a urinary or bowel source. He did have some dysuria on admission but it does not sound like a urine sample was sent.   At this point, the 2 options would be to treat him with IV medication, either vancomycin or  daptomycin given by PICC for 7-10 dats total from his negative blood culture.   Another option would be linezolid twice a day for the same duration. I would favor the linezolid if his insurance will cover it once he goes home. He does have anemia and thrombocytopenia, and the linezolid may worsen that, but with short-term use, I think he would tolerate it.   I will discuss with the case manager whether his insurance will cover linezolid. For now continue vancomycin.   Thank you for the consult. I will be glad to follow with you.   ____________________________ Cheral Marker. Ola Spurr, MD dpf:np D: 10/11/2013 19:26:09 ET T: 10/11/2013 19:55:34 ET JOB#: [id number]  cc: Cheral Marker.  Ola Spurr, MD, <Dictator> Shadi Sessler Ola Spurr MD ELECTRONICALLY SIGNED 10/11/2013 20:36

## 2014-12-16 NOTE — H&P (Signed)
PATIENT NAME:  Johnathan [name], Johnathan [name] MR#:  [id number] DATE OF BIRTH:  08-17-1935  DATE OF ADMISSION:  10/21/2013  PRIMARY CARE PHYSICIAN: Nonlocal.   EMERGENCY ROOM PHYSICIAN: Dr. Cyril Loosen  REASON FOR ADMISSION: Shortness of breath.   HISTORY OF PRESENT ILLNESS: The patient is a 80 year old male with multiple recent admissions for sepsis with pseudomonas and also recent sepsis , with VRE bacteremia, Just discharged to [facility] on 20th of February. Comes back because of shortness of breath, wheezing. O2 sats were 89% on 4 liters. The patient usually uses 2 liters. It has been increased to 4 liters. The patient also had wheezing and pedal edema.   PAST MEDICAL HISTORY:  1.  Recently discharged from hospital after he stayed from 13th February to 20th February. Had VRE bacteremia. Discharged with Zyvox for 7 days, which he is going to finish it today. He has a history of alpha-I antitrypsin deficiency with recurrent lung infections and liver cirrhosis. 2.  COPD, chronic respiratory failure, on 2.5 liters of oxygen. 3.  Diabetes. 4.  Hypothyroid. 5.  Hyperlipidemia.  6.  The patient had pleural effusion drained. The patient had thoracocentesis. The patient had pleural fluid drained by radiology on 15th February and in January also.  ALLERGIES: LIPITOR, SIMVASTATIN.  SOCIAL HISTORY: He is a resident of [facility]. His last discharge, that is since 1 week.   MEDICATIONS: According to last discharge summary: 1.  Finasteride 5 mg p.o. daily. 2.  Symbicort 160/4.5 mg 2 puffs b.i.d.  3.  Loratadine 10 mg p.o. daily.  4.  Niacin 5 mg 2 tablets at bedtime.  5.  Omeprazole 20 mg daily.  6.  Probenecid 500 mg p.o. daily.  7.  Aspirin 81 mg daily.  8.  Theophylline 100 mg p.o. daily. 9.  Cardizem CD 300 mg p.o. daily.  10.  Benadryl 25 mg at bedtime.  11.  Coreg 12.5 mg p.o. b.i.d.   12.  Albuterol 4 times daily.  13.  Digoxin 120 mg 1/2 tablet daily.  14.  Albuterol 2 puffs 4 times daily.  15.  Lasix  80 mg p.o. b.i.d.   16.  Glipizide 10 mg p.o. daily.  17.  Novolin NPH 25 units in the morning. 18.  Synthroid 88 mcg p.o. daily.  19.  Lisinopril 20 mg p.o. daily.  20.  Flonase 2 sprays in each nostril daily.  21.  KCl 20 mEq daily.  22.  Crestor 10 mg at bedtime.  23.  Spiriva 1 puff daily.  24.  Prednisone 10 mg daily for 1 day.  25.  Zyvox 600 mg p.o. b.i.d. for 8 days.   PAST SURGICAL HISTORY: Significant for tonsillectomy and appendectomy, and recurrent pleural effusions drained.   REVIEW OF SYSTEMS: CONSTITUTIONAL: Has fatigue. No fever.  EYES: No blurred vision.  ENT: No tinnitus. No ear pain. No epistaxis. No difficulty swallowing.  RESPIRATORY: Does have cough, unable to get the phlegm out, shortness of breath, wheezing and pedal edema.  CARDIOVASCULAR: No chest pain. Does have PND and pedal edema.  GASTROINTESTINAL: No nausea. No vomiting. No abdominal pain.  GENITOURINARY: No dysuria.  ENDOCRINE: No polyuria or nocturia.  INTEGUMENT: The patient has weeping skin breakdown on the legs because of edema.  MUSCULOSKELETAL: Not joint pain. NEUROLOGIC: No numbness or weakness.  PSYCHIATRIC: No anxiety or insomnia.   PHYSICAL EXAMINATION: VITAL SIGNS: Temperature 98.7, heart rate 84, blood pressure 104/44, sats on 4 liters 96%.  Repeat blood pressure during my visit was 80/37, pulse 77,  and sats 99% on 4 liters.  GENERAL: Chronically ill-appearing male, not in distress.  HEAD: Normocephalic, atraumatic.  EYES: Pupils equal and reacting to light. Extraocular movements intact.  NOSE: No nasal lesions. No postnasal drainage.  EARS: No drainage. No external lesions.  MOUTH: No lesions. No exudates.  NECK: Supple. No JVD. No carotid bruit. The patient has no tenderness in the neck and normal range of motion without pain.  LUNGS: Decreased breath sounds bilaterally and basilar crepitations present and did not hear any wheezing.  HEART: S1, S2 regular. The patient has no murmurs.  The patient's pulses are equal in carotid and femoral. Does have pedal edema, 2+, up to the knees. MUSCULOSKELETAL: Able to move extremities. Strength poor. SKIN: Inspection is normal.  LYMPHATIC: No lymphadenopathy.  NEUROLOGIC: Cranial nerves II through XII intact. Power 5/5 in upper and lower extremities. Sensation is intact. DTRs 2+ bilaterally.  PSYCHIATRIC: Mood and affect are within normal limits.   DIAGNOSTICS: Electrolytes: Sodium is 128, potassium 4, chloride 90, bicarb 33, BUN 48, creatinine 2, glucose 139.   WBC 20.3, hemoglobin 9.2, hematocrit 28.1, platelets 101,000. BNP 3071.   Chest x-ray showed right pleural effusion is worse with right lower lobe atelectasis and also improvement IN the left effusion and left lower lobe consolidation. Mild vascular congestion present.   ASSESSMENT AND PLAN: The patient is a 80 year old gentleman with multiple medical problems of alpha-I antitrypsin deficiency, chronic emphysema on home oxygen and multiple lung infections with history of hypertension, diabetes, and recent admission for Vancomycin-resistant enterococcal who comes in because of shortness of breath and found to have a pleural effusion and also mild congestive heart failure and also sepsis with septic shock. 1.  Septic shock, likely secondary to pneumonia with elevated white count and also pleural effusions. The patient is right now on Zosyn and  Zyvox and blood cultures have been ordered. The patient has history of loculated pleural effusion last time and had thoracocentesis done from his left side. Right now does have right pleural effusion. I spoke with Dr. Rosalene Billingsegister. He does not think there is any drainable collection, but we will get a CT of the chest without contrast and see if this needs to be drained, and the patient right now is on oxygen and fluid cytology from last time did not show any malignancy. I believe this is recurrent pleural effusion due to his alpha-1 antitrypsin  deficiency. The patient will get pulmonary consult. Continue Levophed for keeping the map more than 60. Follow the cultures. Continue empiric antibiotics.  2.  Respiratory failure secondary to pleural effusion, acute on chronic respiratory failure due to pleural effusion and also mild congestive heart failure exacerbation. The patient cannot get IV Lasix because of hypotension. We will give IV Levophed to keep the blood pressure up and consult cardiology to see if he needs   dobutrex drip.  3.  Recurrent pneumonias and also Vancomycin-resistant enterococcus from the cultures last time, from the blood. Repeat blood cultures were negative, but he is on Zyvox. Continue that. Repeat cultures at this time. Palliative care saw him the last time and he is supposed to follow up with home hospice after his discharge from rehab. I have called Dr. Harvie JuniorPhifer. We are going to talk to her again and see if the patient  can to go to hospice home and consult placed with Dr. Harvie JuniorPhifer to discuss the goals of treatment.  4.  Diabetes mellitus, type 2. Right now will do sliding scale with coverage.  5.  Acute renal failure due to septic shock and hypotension, likely prerenal. The patient's ACE inhibitors and Lasix are stopped. Monitor urine output and avoid Lasix and lisinopril.  6.  Hypothyroidism. Continue Synthroid.  7.  History of chronic obstructive pulmonary disease. The patient is on Symbicort and albuterol nebulizers. Will continue the albuterol and add Solu-Medrol and continue nebulizers as I mentioned. Continue theophylline and Spiriva.  9.  Chronic atrial fibrillation. On Cardizem and digoxin. Right now rate is stable and he is hypotensive, so we are giving pressors. If needed we could use Cardizem drip or if blood pressure improves we can start the p.o. medication, Cardizem, at lower dose like 120 mg of Cardizem CD and see how he does. Repeat chest x-ray tomorrow and we will also do CT chest to evaluate for pleural  effusion, if it needs to be drained.   CONDITION: Stable. This is a critical care history and physical.   CODE STATUS: DNR.   Discussed the plan with the patient's wife and daughter.   TIME SPENT: 60 minutes.    ____________________________ Katha Hamming, MD sk:sb D: 10/21/2013 13:17:58 ET T: 10/21/2013 14:13:08 ET JOB#: [id number]  cc: Katha Hamming, MD, <Dictator> Katha Hamming MD ELECTRONICALLY SIGNED 11/07/2013 11:52

## 2014-12-16 NOTE — Consult Note (Signed)
   Comments   I met with pt's wife and one of his daughters. Updated them on pt's condition. They now understand that pt is approaching end of life. They are very anxious for pt to be able to return home. I discussed the involvement of hospice in the home and they are in agreement with this. Hospice screen ordered. Plan for d/c to home tomorrow if possible.   Electronic Signatures: Ailed Defibaugh, Izora Gala (MD)  (Signed 04-Mar-15 15:03)  Authored: Palliative Care   Last Updated: 04-Mar-15 15:03 by Tashawn Greff, Izora Gala (MD)

## 2014-12-16 NOTE — Consult Note (Signed)
PATIENT NAME:  Johnathan [name], LAPPE MR#:  [id number] DATE OF BIRTH:  [birth year]  DATE OF CONSULTATION:  10/21/2013  REFERRING PHYSICIAN:  Dr. Allena Katz CONSULTING PHYSICIAN:  Stann Mainland. [name], MD  REASON FOR CONSULTATION: Sepsis and pneumonia and prior VRE bacteremia.   HISTORY OF PRESENT ILLNESS: This is a pleasant 80 year old gentleman with a history of alpha-1 antitrypsin deficiency who was recently discharged on the 20th after an admission for worsening respiratory failure and fever. During that admission, he was found to have bacteremia with enterococcus. He was discharged on Zyvox and remains on it at [facility] where he has been residing. He was readmitted today for worsening respiratory status. He was having some subjective feelings of heat, but no actual fevers at the nursing home. Of note, he has had no dysuria or hematuria. He does not have a chronic Foley. He has been having some mild abdominal pain and cramps and gas, but no diarrhea.   PAST MEDICAL HISTORY: 1.  Congestive heart failure.  2.  Chronic respiratory failure from COPD exacerbation.  3.  Alpha-1 antitrypsin deficiency. 4.  A. fib. 5.  Gout. 6.  Hypertension. 7.  Diabetes. 8.  Tonsillectomy. 9.  Appendectomy. 10.  Recent right-sided pleural effusion, status post thoracentesis.   SOCIAL HISTORY: The patient was living at home with his wife. He is retired from Animator work. He was really not much of a smoker, having quit over 40 years ago. He does not drink alcohol. He is currently residing at [facility].   FAMILY HISTORY: Positive for COPD and hypertension. His sister also has alpha-1 antitrypsin.   ALLERGIES: LIPITOR AND SIMVASTATIN.   ANTIBIOTICS SINCE ADMISSION: Include Zosyn and Zyvox. He was on Zyvox as an outpatient for the bacteremia.   REVIEW OF SYSTEMS: Eleven systems reviewed and negative except as per HPI.   PHYSICAL EXAMINATION: VITAL SIGNS: Temperature 98.7, pulse 84, blood pressure 104/44, sat 96% on O2.   GENERAL: He is a thin gentleman on oxygen, speaking in full sentences, but disheveled and chronically ill-appearing.  HEENT: Pupils equal, round and reactive to light and accommodation. Extraocular movements are intact. Sclerae anicteric. Oropharynx: Mucous membranes are dry.  NECK: Supple.  HEART: Regular.  LUNGS: Have decreased breath sounds and rhonchi bilaterally.  ABDOMEN: Soft, mildly distended, nontender. No hepatosplenomegaly.  EXTREMITIES: He has 3+ edema in the bilateral lower extremities as well as upper extremities.  NEUROLOGIC: He is alert and oriented x 3. Grossly nonfocal neuro exam.   LABORATORY AND RADIOLOGICAL DATA: Labs are reviewed. CBC shows a white count of 20.3, hemoglobin 9.2, platelets 101. Urinalysis is negative. Albumin is quite low at 1.9, alkaline phosphatase 144, AST 47, troponin negative. BNP 3000. BUN 48, creatinine 2.07; sodium is 128; bicarbonate is 33, chloride 90. Cultures are pending.   Chest x-ray 02/27 compared to 02/15 shows progression of right pleural effusion and right lower lobe atelectasis or infiltrate, improvement in the left effusion and left lower lobe consolidation, and mild vascular congestion.   IMPRESSION: A 80 year old gentleman with chronic obstructive pulmonary disease and recurrent pneumonia, alpha-1 antitrypsin deficiency, recent enterococcal bacteremia, admitted with recurrent pneumonia. He has been on Zyvox at home. He had cleared his blood cultures prior to discharge. He had had an echocardiogram on January 20, which was prior to this most recent episode of bacteremia.   RECOMMENDATIONS: 1.  Continue Zyvox and Zosyn for broad coverage.  2.  Check sputum culture.  3.  Await blood cultures to document clearance.  4.  Will recheck  an echo, transthoracic, given his bacteremia and progressive illness. I doubt he has endocarditis, but it would be worth it to document at least a negative TTE since his bacteremia episode.  5.  Further  recommendations will be based on culture results and his clinical response.   Thank you for the consult. I will be glad to follow with you.   ____________________________ Stann Mainlandavid P. Sampson GoonFitzgerald, MD dpf:jcm D: 10/21/2013 14:14:00 ET T: 10/21/2013 16:14:24 ET JOB#: [id number]  cc: Stann Mainlandavid P. Sampson GoonFitzgerald, MD, <Dictator> Oaklan Persons Sampson GoonFITZGERALD MD ELECTRONICALLY SIGNED 10/31/2013 19:01

## 2014-12-16 NOTE — Discharge Summary (Signed)
PATIENT NAME:  Johnathan [name], Johnathan [name] MR#:  [id number] DATE OF BIRTH:  11-Jul-1935  DATE OF ADMISSION:  10/21/2013 DATE OF DISCHARGE:  10/27/2013  ADMITTING DIAGNOSIS: Shortness of breath.   DISCHARGE DIAGNOSES: 1.  Acute respiratory failure, felt to be multifactorial including reaccumulation of his pleural effusion, possible congestive heart failure.  2.  Possible pneumonia.  3.  Acute chronic obstructive pulmonary disease exacerbation.  4.  Septic shock requiring pressors.  5.  Acute renal failure, possibly due to acute tubular necrosis. 6.  Chronic atrial fibrillation, poorly controlled.  7.  Diabetes.  8.  Hypertension.  9.  Severe anasarca. 10.  Liver cirrhosis.  11.  Antitrypsin deficiency.  12.  Right lobe lung mass status post bronchoscopy.  13.  Benign prostatic hypertrophy.  14.  Chronic respiratory failure, oxygen dependent.  15.  Gout.  16.  Hypothyroidism.  17.  Seasonal allergies.  18.  Failure to thrive.  CONSULTANTS DURING THIS HOSPITALIZATION: Dr. Ermalinda Memos.   PERTINENT LABS AND EVALUATIONS: EKG: A-fib, nonspecific ST-T wave changes.   Urinalysis: Nitrites negative, leukocytes negative. Troponin less than 0.02. LFTs showed bili total 0.6, alk phos 144, ALT 36, AST 47, total protein 5.3, glucose 139, BUN 48, creatinine 2.07, sodium 128, potassium 4, chloride 90, CO2 33. WBC 20.3, hemoglobin 9.2, platelet count 101,000. BNP 3071. Blood cultures: No growth.   PA and lateral chest x-ray shows progressive right-sided pleural effusion and right lower lobe atelectasis.   HOSPITAL COURSE: Please refer to H and P done by the admitting physician. The patient is a 80 year old white male who has had multiple admissions throughout the past few months related to acute on chronic respiratory failure, pleural effusion, that has required multiple thoracenteses. He has progressive liver cirrhosis, who has been admitted multiple times with similar symptoms. During this hospitalization, he  presented with similar type of symptoms. He was admitted to the hospital and was treated for possible pneumonia, possible COPD exacerbation, as well as CHF.  Even despite these treatments, he continued to not do well. He required pressors to help with his blood pressure. Again, palliative care was consulted. The patient was already DNR, but now the family and the patient have come to the realization that his condition is very poor and the chance of him recovering from this is very unlikely; therefore, they have opted for home hospice with the goal of not bringing the patient back to the hospital, and he will likely pass away at home.   DISCHARGE MEDICATIONS: Finasteride 5 mg daily, budesonide 2 puffs b.i.d., niacin 500 two tabs at bedtime, probenecid 500 daily, theophylline 100 daily, albuterol 4 times a day as needed, digoxin 125 mcg 1/2 tab daily, albuterol 2 puffs 4 times a day as needed, glipizide 10 mg daily, Novolin 25 units daily, levothyroxine 88 mcg daily, mometasone 50 mcg 2 sprays to nostrils at bedtime, lovastatin 10 mg daily, insulin sliding scale as taking before, Robitussin 5 mL q. 4 p.r.n., Spiriva 18 mcg daily, morphine 20 mg/mL 0.5 mL q. 4 p.r.n., Ativan 0.5 one tab p.o. t.i.d. for agitation.   HOME OXYGEN: 3 liters.   DIET: Regular.   ACTIVITY: As tolerated.   DISCHARGE FOLLOWUP: Home hospice. Follow-up with primary MD if the patient desires in 1 to 2 weeks.   TIME SPENT ON DISCHARGE: 35 minutes.  ____________________________ Lafonda Mosses Posey Pronto, MD shp:sb D: 10/28/2013 08:48:55 ET T: 10/28/2013 09:08:46 ET JOB#: [id number]  cc: Donda Friedli H. Posey Pronto, MD, <Dictator> Alric Seton MD ELECTRONICALLY SIGNED 11/01/2013  15:30

## 2014-12-16 NOTE — Discharge Summary (Signed)
PATIENT NAME:  Johnathan [name], Johnathan [name] MR#:  [id number] DATE OF BIRTH:  09/15/1934  DATE OF ADMISSION:  07/28/2013 DATE OF DISCHARGE:  08/08/2013  DISCHARGE VITALS: Temperature 97.7, heart rate 80, blood pressure 158/74, sats 97% on room air.   DISCHARGE DIAGNOSES: 1.  Acute on chronic respiratory failure secondary to pneumonia.  2.  Acute on chronic obstructive pulmonary disease exacerbation. 3.  Chronic atrial fibrillation. 4.  Hypertension.  5.  Diabetes mellitus type 2. 6.  Benign prostatic hypertrophy.  7.  History of alpha I antitrypsin deficiency. 8.  Hypothyroidism.  9.  History of heart failure, unknown systolic or diastolic.  10.  History of chronic obstructive pulmonary disease.   DISCHARGE MEDICATIONS: 1.  Finasteride 5 mg p.o. daily. 2.  Budesonide/salmeterol 160/4.5 two puffs b.i.d.  3.  Glipizide 10 mg p.o. daily.  4.  Loratadine 10 mg p.o. daily.  5.  Niacin 500 mg 2 tablets once a day. 6.  Omeprazole 20 mg p.o. daily.  7.  Probenecid 500 mg p.o. daily.  8.  Spiriva 18 mcg inhalation daily. 9.  Aspirin 81 mg daily.  10.  Digoxin 125 mcg p.o. daily.  11.  Lasix 40 mg 2 tablets p.o. b.i.d.  12.  Levothyroxine 88 mcg p.o. b.i.d. 13.  Lisinopril 40 mg half tablet daily.  14.  Theophylline 100 mg 1 capsule daily.  15.  Albuterol inhalation 2 puffs every 4 to 6 hours for trouble breathing.  16.  Cardizem LA 300 mg p.o. daily.  17.  Insulin Lantus 24 units in the morning.  18.  Coreg 12.5 mg p.o. b.i.d.  19.  Levaquin 500 mg p.o. daily for 2 weeks.   DIET: Low-sodium, low-fat, ADA.   OXYGEN: 3 liters by nasal cannula.  DISPOSITION: Discharged home with home physical therapy.  HOSPITAL COURSE: Interim discharge summary done by Dr. Auburn Bilberry on 12th of December. Followed the patient from 13 December to 15 December. The patient's interim discharge diagnosis, lab work, and consultations, everything is reviewed. The patient's discharge diagnosis is acute respiratory  failure, as I mentioned. The patient's discharge diagnosis is the same as I mentioned. The patient was seen by physical therapy who recommended home physical therapy. The patient had bronchoscopy and biopsy done. The cytology is negative for malignancy. The patient's AFB concentration is negative. The patient's bronchial wash cultures did not show any growth. The patient had some fungus in there, but no bacteria. CT chest after the bronchoscopy still had a 3.7 x 2.6 cm mass in central right middle lobe. We are treating like  pneumonia at this time. The patient was seen by Dr. Orlie Dakin. The patient needs repeat CT scan in 4 weeks for interval changes. The patient right now is given 2 weeks of Levaquin. He already finished [place] 1 gram IV every 8 hours from December 5th, so he finished 10 days of [place] for dense pneumonia. Place attach this summary to interim discharge summary. The patient right now denies any trouble breathing, feels good. The patient will follow up with Dr. Orlie Dakin in 2 weeks for the further evaluation with CT chest. The patient had diabetes with episode of hypoglycemia, but right now after the bronchoscopy he was able to eat and we had to resume his Lantus and also glipizide. Most recent blood sugars are like in the 159 to 160 range.   The patient did have hyperkalemia yesterday, that is up to 5.6 on December 14. We gave Kayexalate. The patient's repeat potassium is 5.1 this  morning. The patient is stable for discharge.  Please refer to original interim discharge summary and then addendum. Only changes, the patient's biopsy results are negative from bronchoscopy. Cytology is negative for cancer. Right now we re treating like dense pneumonia. Repeat CT chest is recommended by Dr. Orlie DakinFinnegan in 4 weeks to evaluate for intervention. The same is discussed with the wife. The patient will be going home with home physical therapy.   TIME SPENT ON DISCHARGE PREPARATION: More than 30  minutes. ____________________________ Katha HammingSnehalatha Jah Alarid, MD sk:sb D: 08/08/2013 12:46:08 ET T: 08/08/2013 13:35:18 ET JOB#: [id number]  cc: Katha HammingSnehalatha Caliya Narine, MD, <Dictator> Katha HammingSNEHALATHA Greco Gastelum MD ELECTRONICALLY SIGNED 08/27/2013 17:31

## 2014-12-16 NOTE — H&P (Signed)
PATIENT NAME:  Johnathan [name], FUSSELL MR#:  [id number] DATE OF BIRTH:  18-Mar-1935  DATE OF ADMISSION:  09/11/2013  REFERRING ER PHYSICIAN: Dr. Jene Every.  PRIMARY CARE PHYSICIAN: Dr. Freda Munro.   CHIEF COMPLAINT: Hypoglycemia and hypotension.   HISTORY OF PRESENT ILLNESS: The patient is a 80 year old male with history of COPD on chronic oxygen therapy at home who was admitted to the hospital last month after having failure of outpatient therapy for pneumonia and was found having lung mass on CT scan, treated for pneumonia, went home and was further following for his lung mass. Three to four days ago he had a follow-up CAT scan with contrast done to look for progression of the mass and was supposed to follow in the clinic after that, but the patient says that his condition was gradually getting worse in the last few days,  he was having diarrhea for four days and he also had some shortness of breath, which was more than his usual. Today morning his wife found him a little confused and she called EMS. As per EMS, his sugar was 31. They gave him some glucose and brought him over here. Also found having hypotension in the ER, and because of finding of pneumonia, bilateral extensive on the CAT scan, which was done four days ago with presentation of having hypotension and hypoglycemia with confusion, he was provided with a central line and started on vasopressor drip, and hospitalist service is contacted. On further questioning to the patient, currently he is totally alert and oriented and he does not know the event, what exactly happened, but he said that my wife called ambulance because my sugar was low. He says that he was feeling a little short of breath, but not much. He denies any fever or cough. On further questioning, the patient says that he started having diarrhea for the last 3 to 4 days, but denies any fever or vomiting with that.   REVIEW OF SYSTEMS:  CONSTITUTIONAL: Negative for fever, but positive  for fatigue and generalized weakness. No pain or weight loss.  EYES: No blurring, double vision, discharge or redness.  EARS, NOSE, THROAT: No tinnitus, ear pain, hearing loss.  RESPIRATORY: The patient has some shortness of breath and wheezing, but denies any cough.  CARDIOVASCULAR: No chest pain, orthopnea, edema, arrhythmia.   GASTROINTESTINAL: No nausea or vomiting or abdominal pain, but had diarrhea. No blood in vomit or stool.  GENITOURINARY: No dysuria, hematuria, or increased frequency.  ENDOCRINE: No increased sweating. No heat or cold intolerance.  SKIN: No acne, rashes, or lesions.  MUSCULOSKELETAL: No pain or swelling in the joints.  NEUROLOGICAL: No numbness, weakness, tremor or vertigo.  PSYCHIATRIC: No anxiety, insomnia, bipolar disorder.   PAST MEDICAL HISTORY: 1. COPD with chronic respiratory failure. Oxygen dependent at home.  2. Congestive heart failure. Echocardiogram September 2012 was ejection fraction 55%, normal left ventricular thickness, left ventricular motion was normal, right ventricular systolic function with mild to moderate reduced right ventricular systolic pressure is elevated 14% to 40%. Overall it appears diastolic heart failure.  3. Hypertension.  4. Diabetes.  5. History of alpha one antitrypsin deficiency.  6. Finding of 3.7 cm x 2.6 cm mass in the central right middle lobe in December 2014 and.   FAMILY HISTORY: Positive for COPD and hypertension.   SOCIAL HISTORY: The patient quit smoking 40 years ago. Lives with wife. Denies any alcohol or illegal drug use.   ALLERGIES: LIPITOR AND SIMVASTATIN.   MEDICATIONS: As  per discharge last month:  1. Finasteride 5 mg oral once a day.  2. Budesonide inhaler 2 puffs 2 times a day.  3. Glipizide 10 mg oral once a day.  4. Loratadine 10 mg oral once a day.  5. Niacin 500 mg oral tablet 2 tablets once a day.  6. Omeprazole 20 mg oral delayed-release once a day.  7. Spiriva 18 mcg inhalation once a day.   8. Aspirin enteric-coated 81 mg once a day.  9. Multivitamin with minerals once a day.  10. Digoxin 125 mcg once a day.  11. Lasix 40 mg oral 2 tablets 2 times a day.  12. Levothyroxine 88 mcg 1 capsule 2 times a day.  13. Lisinopril 40 mg take 1/2 tablet once a day.  14. Theophylline 100 mg 24-hour capsule once a day.  15. Albuterol 2 puffs inhalation every 4 to 6 hours as needed for shortness of breath.  16. Cardizem 300 mg 24-hour tablet extended-release once a day.  17. Insulin detemir 24 subcutaneous once a day.  18. Diphenhydramine 25 mg oral tablet once a day.  19. Carvedilol 12.5 mg 2 times a day.  20. Albuterol and ipratropium 1 inhalation 4 times a day.  21. Levaquin 500 mg was given on discharge last admission, which was finished   PHYSICAL EXAMINATION: VITAL SIGNS: In the ER, pulse noted to be ranging from 100 to 125, respirations 25 to 35, and blood pressure with Levophed currently it came up to 77/45 and IV fluids, oxygen, saturation 96 with 4 liters oxygen supplementation.  GENERAL: The patient is alert and oriented to time, place, and person appear in mild respiratory distress currently and overall weak. HEENT: Head and neck atraumatic. Conjunctiva pink. Oral mucosa moist.  NECK: Supple. No JVD.  RESPIRATORY: Bilateral equal air entry with some crepitation and wheezing present.  CARDIOVASCULAR: S1, S2 present, regular tachycardia. No murmur.  ABDOMEN: Soft, nontender. Bowel sounds present. No organomegaly.  SKIN: No rashes.  LEGS: Mild edema present.  JOINTS: No swelling or tenderness.  NEUROLOGICAL: Power 4/5. Generalized weakness. Moves all four limbs.  PSYCHIATRIC: Does not appear in any acute psychiatric illness at this time.   IMPORTANT LABORATORY RESULTS: Glucose was 63 on presentation, currently 133, BNP was 4186, BUN 37, creatinine 1.72, sodium 127, potassium 4.3, chloride 92, CO2 28, magnesium is 1.4.   Total protein 5.9, albumin 1.9, bilirubin 2.5, alkaline  phosphate 199, SGOT 212 and SGPT 89.   Troponin 0.03. WBC 11.3, hemoglobin 9.5, platelet count 122, MCV is 93.   Chest x-ray, portable, single view today bilateral effusion and underlying lower lobe consolidation suggestion of atelectasis, right greater than left. Underlying pneumonia is not excluded.   ASSESSMENT AND PLAN: A 80 year old male with a past medical history of chronic obstructive pulmonary disease, chronic oxygen dependence and recently found having some mass, admission to hospital last month for pneumonia. Started having some diarrhea and weakness and shortness of breath and found hypoglycemic by wife today morning.  1. Septic shock. This is secondary to pneumonia. The patient is started on broad-spectrum antibiotic because he was in hospital last month and he has some mass in the right lung, and also started on Levaquin IV drip. We will continue that and we will give IV fluids, saline 80 mL per hour because he also has a component of heart failure, so does not want to give a lot of fluid to him.  2. Acute on chronic respiratory failure. Currently, the patient is breathing at  rate of 30% to 35% and requiring 4 liters oxygen to maintain his saturation at home. He is using 2 liters at baseline. This is secondary to pneumonia and we will treat underlying cause. We will get ABG now and give nebulizer treatment to help him breathe better. 3. Healthcare associated pneumonia. As mentioned above, broad-spectrum antibiotics, culture and treatment for shock.  4. History of chronic obstructive pulmonary disease. Currently, there is mild wheezing, but because of presentation of septic shock and respiratory failure we will give IV steroid and we will give  nebulizer treatment.  5. Congestive heart failure. Most likely this is diastolic heart failure. He does not have any recent echo in our system. BNP is elevated and there is some crepitation present. We will do echocardiogram in this admission.  Currently, because of hypotension he is not a candidate for Lasix IV and we will just continue giving fluids and monitor for further overload or worsening of his heart failure. We will also monitor his troponin meanwhile.    6. Complaint of diarrhea. This is for the last four days. The patient was given antibiotics while in the hospital in the last 2 to 3 months, multiple dose of antibiotic for repeated pneumonias, so we will check his stool for C. difficile and keep him isolation for prophylaxis for now.  7. Worsening kidney function. This is probably due to his hypotension and presence of sepsis. We will give IV fluids and monitor renal function.  8. Elevated LFTs. This appears to be due to her shock and we will  continue monitoring liver function while treating for pneumonia.  9. Hyponatremia. This is secondary to renal failure and diarrhea. We are giving IV fluids and we will monitor it.   Condition is critical.   CODE STATUS. Discussed with the patient. He agrees for FULL CODE.   TOTAL TIME SPENT ON THIS ADMISSION: 60 minutes critical care.    ____________________________ Mirko Tailor G. Elisabeth PigeonVachhani, MD vgv:[id number] D: 01/18/2015Hope Pigeon 09:52:40 ET T: 09/11/2013 10:09:33 ET JOB#: [id number]  cc: Hope PigeonVaibhavkumar G. Elisabeth PigeonVachhani, MD, <Dictator> Altamese DillingVAIBHAVKUMAR Lummie Montijo MD ELECTRONICALLY SIGNED 09/16/2013 18:54

## 2014-12-16 NOTE — Discharge Summary (Signed)
PATIENT NAME:  Johnathan [name], Johnathan [name] MR#:  [id number] DATE OF BIRTH:  [birth year]  DATE OF ADMISSION:  10/07/2013 DATE OF DISCHARGE:  10/14/2013   For a detailed note, please take a look at the history and physical done on admission by Dr. Enedina Finner.   DIAGNOSES AT DISCHARGE: As follows:  1.  Acute on chronic respiratory failure secondary to chronic obstructive pulmonary disease and underlying pleural effusions. 2.  Pleural effusion, status post thoracentesis, likely a transudative effusion related to congestive heart failure and hypoalbuminemia, liver cirrhosis.  3.  Chronic obstructive pulmonary disease.  4.  Diabetes.  5.  Hypothyroidism.  6.  Hyperlipidemia.  7.  Vancomycin-resistant enterococci bacteremia secondary to underlying urinary tract infection.   DIET: The patient is being discharged on a low-sodium, low-fat, American Diabetic Association diet.   ACTIVITY: As tolerated.   DISCHARGE MEDICATIONS: As follows: Finasteride 5 mg daily, Symbicort 160/4.5 two puffs b.i.d., loratadine 10 mg daily, niacin 5 mg 2 tabs at bedtime, omeprazole 20 mg daily, probenecid 500 mg daily, aspirin 81 mg daily, multivitamin daily, theophylline 100 mg daily, Cardizem CD 300 mg daily, Benadryl 25 mg at bedtime as needed, Coreg 12.5 mg b.i.d., albuterol nebulizers 4 times daily as needed, digoxin 125 mcg 1/2 tab daily, albuterol inhaler 2 puffs 4 times daily as needed, Lasix 80 mg b.i.d., glipizide 10 mg daily, Novolin/NPH 25 units in the morning. Synthroid 88 mcg daily, lisinopril 20 mg daily, Flonase 2 sprays to each nostril daily, menthol/methyl salicylate topical 10%/15% topical cream to be applied t.i.d. as needed, potassium 20 mEq daily, Crestor 10 mg at bedtime, Spiriva 1 puff daily, prednisone 10 mg daily x 1 day, and Zyvox 600 mg b.i.d. x 8 days.   CONSULTANTS DURING THE HOSPITAL COURSE: Dr. Clydie Braun from infectious disease.   PERTINENT STUDIES DONE DURING THE HOSPITAL COURSE: As follows: A CT  scan of the chest, abdomen, and pelvis done with contrast on February 13 showing no evidence of thoracic or abnormal aortic dissection, no acute intra-abdominal or pelvic pathology, moderate to large pleural effusion, ascites, and soft tissue edema, most compatible with third spacing or hypoproteinemia due in part due to presence of probable hepatic cirrhosis.   Chest x-ray done post thoracentesis showing continued basilar opacification, likely small effusions with atelectasis, mild improvement in the right lung base opacification, no definite pneumothorax.   Blood cultures to be positive for vancomycin-resistant enterococcus. Repeat blood cultures on February 14, which are showing no growth as of now.   HOSPITAL COURSE: This is a 80 year old male with multiple medical problems as mentioned above who presented to the hospital on October 07, 2013, due to shortness of breath and in acute respiratory failure.  1.  Acute respiratory failure. This was likely acute on chronic respiratory failure secondary to underlying COPD and also complicated with possible worsening pleural effusions. The patient was treated for both these conditions with IV steroids, around-the-clock nebulizer treatments, maintained on his Spiriva, theophylline, and Symbicort. The patient was also given diuretics with IV Lasix for his large pleural effusions. He also underwent thoracentesis. After aggressive therapy, the patient's clinical condition has significantly improved. He currently has no bronchospasm, wheezing. He is on chronic oxygen, which he will continue at about 2 to 3 liters. He will also continue his diuretics as stated for his underlying diastolic CHF and hypoproteinemia.  2.  COPD exacerbation, likely cause of the patient's acute on chronic respiratory failure. The patient was treated aggressively with IV steroids, around-the-clock nebulizer treatments,  maintained on his Symbicort and Spiriva and theophylline. The patient has  now been weaned off steroids, is currently on an oral prednisone taper, has only 1 day more of prednisone left. His sputum cultures have remained negative. His blood cultures grew out Enterococcal faecalis, which was vancomycin resistant, but the likely source of this is not the sputum but likely urinary tract infection. The patient's shortness of breath has improved. He is currently being discharged on chronic oxygen along with the maintenance meds for his COPD as stated.  3.  Enterococcal bacteremia. This was vancomycin resistant enterococcus. The patient's  source for this was likely the gut or the urinary tract infection. An infectious disease consult was obtained. The patient was at that time being maintained on IV vancomycin, has been switched over from IV vancomycin to oral Zyvox. His repeat blood cultures from the 14th of February are negative. He likely needs 2 weeks of total therapy since his last negative blood culture. Therefore, he has 8 more days of Zyvox left to finish treatment for his VRE bacteremia secondary to UTI.  4.  Type 2 diabetes. The patient's blood sugars have remained labile due to the fact that he was getting IV steroids. This further needs to be monitored. At this point, the patient will continue his glipizide and Novolin N as stated.  5.  Anasarca with volume overload. This is likely secondary to hypoproteinemia and liver cirrhosis. The patient did receive some albumin injections and IV Lasix while in the hospital. He also underwent a thoracentesis. At this point, the patient still appears somewhat volume overloaded but is stable. He will continue his Lasix as stated for his volume overload presently.  6.  Hypothyroidism. The patient was maintained on his Synthroid. He will resume that.  7.  History of chronic atrial fibrillation. The patient has remained rate controlled. At this point, he will continue his Cardizem and Coreg and digoxin for his A. fib. He is not on long-term  anticoagulation given his liver cirrhosis and high fall risk.  8.  Hypertension. The patient remained hemodynamically stable while in the hospital. He will resume his Coreg, lisinopril as stated.  9.  Hyperlipidemia. The patient was maintained on his Crestor, and he will also resume that upon discharge.   The patient has multiple comorbidities and a very poor prognosis. A palliative care consult was obtained to discuss goals of care. The family opted for hospice services, but the hospice services are going to be arranged after he gets discharged from short-term rehab.   The patient is a DNI/DNR.   TIME SPENT ON DISCHARGE: 45 minutes.  ____________________________ Rolly PancakeVivek J. Cherlynn KaiserSainani, MD vjs:jcm D: 10/14/2013 13:26:07 ET T: 10/14/2013 13:46:45 ET JOB#: [id number]  cc: Rolly PancakeVivek J. Cherlynn KaiserSainani, MD, <Dictator> [place] SirenVIVEK J SAINANI MD ELECTRONICALLY SIGNED 10/21/2013 18:04

## 2014-12-16 NOTE — Discharge Summary (Signed)
PATIENT NAME:  Johnathan [name], Johnathan [name] MR#:  [id number] DATE OF BMichaele OfferRTH:  [birth year]  DATE OF ADMISSION:  09/11/2013 DATE OF DISCHARGE:  09/15/2013  REASON FOR ADMISSION: Hypoglycemia and hypotension.   DISPOSITION: Home with home health. Continue physical   therapy. Referral for wound care as the patient had [name] sacral skin breakdown. Followup with Dr. Welton FlakesKhan, Pulmonary.   MEDICATIONS AT DISCHARGE:  1. Finasteride 5 mg once [name] day.  2. Budesonide with formoterol 160/4.5 two puffs twice daily.  3. Loratadine 10 mg once [name] day. 4. Niacin 500 mg 2 tablets once [name] day. 5. Omeprazole 20 mg once [name] day.  6. Probenecid 500 mg once [name] day.  7. Spiriva 18 mcg once [name] day.  8. Aspirin enteric-coated 81 mg once [name] day.  9. Multivitamins once [name] day.  10. Digoxin 1.25 mcg once daily.  11. Lasix 40 mg 2 tablets twice [name] day.  12. Levothyroxine 80 mcg twice daily.  13. Lisinopril 1 po  once [name] day. 14. Theophylline 24 hours 100 mg once [name] day.  15. Albuterol CFC 90 mcg 2 puffs every 4 to 6 hours as needed for shortness of breath.  16. Cardizem 300 mg once [name] day.  17. Insulin Levemir 24 units subcutaneously once [name] day. 18. Diphenhydramine 25 mg at bedtime. 19. Coreg 12.5 mg twice daily.  20. Albuterol nebulizers 4 times per day.  21. Prednisone taper, decrease 10 mg every day for the next 5 days  22. Azithromycin 500 mg once [name] day.  23. Amoxicillin with clavulanate 875 mg/125 mg 2 times daily for 7 days.  24. Lactobacillus acidophilus twice daily for 10 days to prevent diarrhea.   FOLLOWUP: Dr. Gerarda Fractionimothy Finnegan. Follow up with Dr. Freda MunroSaadat Khan and primary care physician.   IMPORTANT RESULTS:  1. CT scan of the chest done on 01/12 to evaluate for possible lung mass showed development of patchy right middle lobe and right lower lobe airspace opacity suspicious for pneumonia. This makes evaluation of chronic right-side pulmonary findings difficult , now  slight improvement in the right middle lobe, central mass-like  opacity, favoring atelectasis. need  follow up with another CT scan in 1 to 2 months.  2. Left-side atelectasis.  3. Slight increase in the right greater than left pleural effusions with mild right-side loculation  , consider thoracentesis 4. Cirrhosis.  5. Cholelithiasis.  6. Chest x-ray on admission: Bilateral effusions with underlying lower lobe consolidation suggesting atelectasis. Ultrasound-guided thoracentesis is done removing 2 liters of clear fluid on the left side.  7. Acute on chronic kidney failure.  8.  Hyponatremia.  9.  Hypomagnesemia.  10.  Elevated LFTs.   ADMISSION LABORATORIES: Glucose 133, creatinine 1.72, sodium 127, magnesium 1.4. White count of 11.3 on admission, hemoglobin  9.5, platelet count of 122.  Followup creatinine at discharge was 1.47. Glucose remained elevated ( due to steroid use.  DISCHARGE DIAGNOSES: 1. Hypoglycemia.  2. Septic shock.  3. Acute on chronic respiratory failure due to hospital- or healthcare-acquired pneumonia.  4. Acute exacerbation of chronic obstructive pulmonary disease. 5. Acute diastolic and systolic heart failure. Echocardiogram ejection fraction of 40%.  6. Acute on chronic renal failure due to sepsis.  7. Diarrhea, likely viral, improved.  8. Hyponatremia due to possible liver disease and fluid overload with congestive heart failure. 9. History of pulmonary mass on the right side. Still under evaluation for possible cancer versus atelectasis.  10. Hypomagnesemia. 11. Thrombocytopenia.  12. Anemia of chronic disease.   HOSPITAL COURSE:  This is [name] very nice 80 year old gentleman with history of COPD, chronic respiratory failure on homeO2 2L nasal cannula.  Admitted to the hospital the previous month after having failure of treating pneumonia outpatient. He was found to have [name] possible lung mass on the CT scan for what he was treated for pneumonia and had [name] followup with Dr. Orlie Dakin to check on that mass. The patient had [name] followup  CAT scan with contrast that look for the correction of the mass.  He was supposed to follow up in the clinic after that, but the patient said that he was starting to get really weak, really worse within [name]  few days and developed diarrhea for what he did not want to go to the appointment. The morning of admission the patient was found very confused by his wife, who called EMS. His sugar was 31 on arrival for what the patient got some glucose. During evaluation, the patient seemed to be hypotensive as well. Because of the findings of pneumonia on the previous CT scan, the patient was treated empirically. He was admitted with [name] diagnosis of sepsis. His heart rate was in between 100 and 125, respirations were about 25 to 35, and his blood pressure was 77/45, started on Levophed. His oxygen saturation was 96% on 4 L.   HOSPITAL PROBLEMS:  1. Severe sepsis/septic shock. The patient was started on Levophed back  in the ER, he had [name] very low blood pressures of 70s/40s. The patient got IV fluids and started to improve. Blood cultures were taken, and we started with broad-spectrum antibiotics. He had 1 blood culture that was positive for pseudomonas, only 1 bottle, for what we called it [name] contamination. C-diff was negative.  Thoracenteses pleural fluid was sent with no growth. The examination of the pleural fluid was mostly consistent with [name] transudate rather than an exudates, for what empyema was ruled out. Pleural fluid cytometry was negative for malignant cells.. C-diff was negative. Giardia was negative  .  Legionella was negative.  2. Septic shock. The patient required Levophed, admitted to the ICU, requiring IV fluids. His blood pressure improved after starting antibiotics to cover for healthcare-acquired pneumonia with Zosyn, azithromycin, and vancomycin.  The patient had [name] blood culture that showed pseudomonas in 1 bottle only with significant improvement of his symptoms right after antibiotics started.  We did  not think the patient was significantly bacteremic and this blood culture seemed to be mostly [name] contamination. The patient was on Levophed and Levophed was weaned off.  3. Acute on chronic respiratory failure due to #1, , #2, diastolic and systolic heart failure. As far as his pneumonia, the patient was found to have pneumonia on CT scan. He still needs to followup with Dr. Orlie Dakin for evaluation of possible cancer in the right side.  Waitfor clearance of infiltrate about  month , for the CT scan to be redone in order to exclude  pneumonia. He was started on vancomycin, Zosyn, and azithromycin. By discharge, the patient is discharged on Augmentin and azithromycin.   4. The patient had CT scan that showed possible loculated effusion, for what [name] thoracentesis was perfrmefd fluid,  which was sent to the lab for evaluation showing [name] transudate rather than an exudate. No signs of infection, no white blood cells. 5. As far as his acute COPD exacerbation, the patient was continued on steroids. Due to the steroid use, the patient had  significant elevation of his glucose, for what insulin was elevated. The  patient was admitted with hypoglycemia . The patient states thathe was not eat. We are going to let his primary care physician continue to address this issue. Also as the patient starting to get off steroids, his insulin needs are going to be lower and his blood sugars are going to be better. 6. As far as his acute diastolic and systolic heart failure, the patient had an echocardiogram that showed an ejection fraction of 40% in the past. He was started on IV Lasix he improved. He is on an ACE inhibitor.   7. Acute renal failure due to sepsis, improved at discharge. 8. Diarrhea, likely secondary to antibiotics on previous and this admission. C-diff was negative.  9. As far as his hypoglycemia, that resolved.  TIME SPENT: I spent about 45 minutes with this discharge on the day of discharge. Please see medication  discharge reconciliation.  ____________________________ Felipa Furnace, MD rsg:sg D: 09/19/2013 07:52:51 ET T: 09/19/2013 09:24:35 ET JOB#: [id number]  cc: Felipa Furnace, MD  Rayven Hendrickson Juanda Chance MD ELECTRONICALLY SIGNED 09/23/2013 6:35
